# Patient Record
Sex: Female | Born: 1995 | Race: Black or African American | Hispanic: No | Marital: Single
Health system: Midwestern US, Community
[De-identification: ages and names within clinical notes are randomized; demographics above are authoritative.]

## PROBLEM LIST (undated history)

## (undated) DIAGNOSIS — F32A Depression, unspecified: Secondary | ICD-10-CM

## (undated) DIAGNOSIS — B999 Unspecified infectious disease: Secondary | ICD-10-CM

## (undated) DIAGNOSIS — O24419 Gestational diabetes mellitus in pregnancy, unspecified control: Secondary | ICD-10-CM

## (undated) DIAGNOSIS — N83209 Unspecified ovarian cyst, unspecified side: Secondary | ICD-10-CM

## (undated) DIAGNOSIS — F329 Major depressive disorder, single episode, unspecified: Secondary | ICD-10-CM

## (undated) DIAGNOSIS — Z9889 Other specified postprocedural states: Secondary | ICD-10-CM

## (undated) HISTORY — PX: TONSILLECTOMY: SUR1361

## (undated) HISTORY — PX: KNEE SURGERY: SHX244

---
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
## 2016-08-12 DIAGNOSIS — R519 Headache, unspecified: Secondary | ICD-10-CM

## 2016-08-12 NOTE — ED Notes (Signed)
Visual acuity:  Patient states, "i cannot see out of the right eye, I can see light but I cannot see those letters"  Left eye: 20, 80  Both eyes together: 20, [address]      Maayan Jenning, RN  08/12/16 2041

## 2016-08-12 NOTE — Other (Unsigned)
Patient Acct Nbr: [id number]   Primary AUTH/CERT:   Primary Insurance Company Name: [employer]  Primary Insurance Plan name: [employer]  Primary Insurance Group Number:   Primary Insurance Plan Type: Health  Primary Insurance Policy Number: [id number]

## 2016-08-12 NOTE — ED Provider Notes (Signed)
[id number] EMERGENCY DEPT  eMERGENCY dEPARTMENT eNCOUnter      Pt Name: Kimberly Mcknight  MRN: [id number]  Birthdate December 04, 1995  Date of evaluation: 08/12/2016  Provider: Lara MulchHARLES Kwabena Strutz, PA     CHIEF COMPLAINT       Chief Complaint   Patient presents with   ??? Eye Pain   ??? Headache         HISTORY OF PRESENT ILLNESS   (Location/Symptom, Timing/Onset, Context/Setting, Quality, Duration, Modifying Factors, Severity) Note limiting factors.   HPI    Kimberly Mcknight is a 20 y.o. female who presents to the emergency department With chief complaint of 48 hours of worsening headache on her RIGHT temple which is now spreading to her LEFT temple. Patient has diagnosed optic neuritis states she has been having a problem with it for one month. Patient states she sees a Geophysical data processoroptical neurologist in [place] in [place] but is here in [place] visiting friends. Patient notes that she has blurry vision in her RIGHT eyes since this episode started one month ago. Patient notes that she is beginning to develop blurry vision slowly in her left eye and is noting pain that tracks down the side of her temple toward her ear. Patient states this is 10 out of 10 and she notes that light aggravates this pain she notes no alleviating factors. Denies sudden loss of vision, [facility], optical flashing or pain on motion of her eyes. Eyes fever chills and sweats      Nursing Notes were reviewed.    REVIEW OF SYSTEMS    (2+ for level 4; 10+ for level 5)   Review of Systems   Constitutional: Negative for diaphoresis and fever.   HENT: Negative for trouble swallowing and voice change.    Eyes: Positive for photophobia, pain and visual disturbance. Negative for discharge.   Respiratory: Negative for cough, shortness of breath, wheezing and stridor.    Cardiovascular: Negative for chest pain, palpitations and leg swelling.   Gastrointestinal: Negative for abdominal pain, constipation, diarrhea, nausea and vomiting.   Genitourinary: Negative for  dysuria and hematuria.   Musculoskeletal: Negative for gait problem and neck pain.   Skin: Negative for color change.   Neurological: Positive for headaches. Negative for light-headedness.   Psychiatric/Behavioral: Negative for behavioral problems, confusion and sleep disturbance.       PAST MEDICAL HISTORY   No past medical history on file.    SURGICAL HISTORY     No past surgical history on file.    CURRENT MEDICATIONS       Previous Medications    No medications on file       ALLERGIES     Sulfa antibiotics    FAMILY HISTORY     No family history on file.     SOCIAL HISTORY       Social History     Social History   ??? Marital status: Single     Spouse name: N/A   ??? Number of children: N/A   ??? Years of education: N/A     Social History Main Topics   ??? Smoking status: Not on file   ??? Smokeless tobacco: Not on file   ??? Alcohol use Not on file   ??? Drug use: Unknown   ??? Sexual activity: Not on file     Other Topics Concern   ??? Not on file     Social History Narrative   ??? No narrative on file  SCREENINGS           PHYSICAL EXAM    (up to 7 for level 4, 8 or more for level 5)     ED Triage Vitals [08/12/16 1958]   BP Temp Temp Source Pulse Resp SpO2 Height Weight   126/83 97.8 ??F (36.6 ??C) Oral 93 16 100 % 5\' 2"  (1.575 m) 124 lb (56.2 kg)       Physical Exam   Constitutional: She is oriented to person, place, and time. She appears well-developed and well-nourished.   HENT:   Head: Normocephalic and atraumatic. Head is without raccoon's eyes, without Battle's sign, without abrasion and without contusion. Hair is normal.   Right Ear: Hearing, tympanic membrane, external ear and ear canal normal.   Left Ear: Hearing, tympanic membrane, external ear and ear canal normal.   Mouth/Throat: Oropharynx is clear and moist. Mucous membranes are not dry. No oropharyngeal exudate, posterior oropharyngeal edema, posterior oropharyngeal erythema or tonsillar abscesses.   No tenderness to palpation in the distribution of the  temporal artery.   Eyes: Conjunctivae and EOM are normal. Pupils are equal, round, and reactive to light. Left conjunctiva is not injected. No scleral icterus.   No visible papilledema     Neck: Normal range of motion. Neck supple. No spinous process tenderness and no muscular tenderness present. No neck rigidity. No edema, no erythema and normal range of motion present.   Cardiovascular: Normal rate, regular rhythm, S1 normal and S2 normal.  PMI is not displaced.  Exam reveals no gallop, no distant heart sounds and no friction rub.    No murmur heard.  Pulmonary/Chest: Effort normal and breath sounds normal. No accessory muscle usage. No respiratory distress. She has no decreased breath sounds. She has no wheezes. She has no rhonchi. She has no rales.   Abdominal: Soft. Bowel sounds are normal. She exhibits no shifting dullness, no distension, no pulsatile liver, no fluid wave, no abdominal bruit, no ascites and no pulsatile midline mass. There is no tenderness. There is no rigidity, no rebound and no guarding.   Musculoskeletal: Normal range of motion. She exhibits no deformity.   Neurological: She is alert and oriented to person, place, and time. She displays no tremor. No cranial nerve deficit or sensory deficit. She exhibits normal muscle tone. GCS eye subscore is 4. GCS verbal subscore is 5. GCS motor subscore is 6.   Reflex Scores:       Patellar reflexes are 2+ on the right side and 2+ on the left side.       Achilles reflexes are 2+ on the right side and 2+ on the left side.  Normal finger-nose. Vision normal to direct confrontation. Extraocular motion intact without nystagmus. Speech clear, no facial droop, no deviation on tongue extension, normal shoulder shrug, normal palate lift.  No aphasia.   Skin: Skin is warm and dry.   Psychiatric: Judgment and thought content normal.   Nursing note and vitals reviewed.      DIAGNOSTIC RESULTS     EKG (Per Emergency Physician):       RADIOLOGY (Per Emergency  Physician):       Interpretation per the Radiologist below, if available at the time of this note:  No results found.    ED BEDSIDE ULTRASOUND:   Performed by ED Physician - none    LABS:  Labs Reviewed   URINE CULTURE   URINALYSIS    Narrative:     Test Performed by [facility], [address] [place], [place]  [address]   PREGNANCY, URINE    Narrative:     Test Performed by [facility], [address] [place], [place]  [address]        All other labs were within normal range or not returned as of this dictation.    EMERGENCY DEPARTMENT COURSE and DIFFERENTIAL DIAGNOSIS/MDM:   Vitals:    Vitals:    08/12/16 1958   BP: 126/83   Pulse: 93   Resp: 16   Temp: 97.8 ??F (36.6 ??C)   TempSrc: Oral   SpO2: 100%   Weight: 56.2 kg (124 lb)   Height: 5\' 2"  (1.575 m)       Medications   0.9 % sodium chloride bolus (0 mLs Intravenous Stopped 08/12/16 2204)   metoclopramide (REGLAN) injection 10 mg (10 mg Intravenous Given 08/12/16 2049)   diphenhydrAMINE (BENADRYL) injection 50 mg (50 mg Intravenous Given 08/12/16 2048)   methylPREDNISolone sodium (SOLU-MEDROL) injection 125 mg (125 mg Intravenous Given 08/12/16 2049)   ondansetron (ZOFRAN) injection 4 mg (4 mg Intravenous Given 08/12/16 2140)       MDM.  Patient had relief of symptoms after Zofran, Reglan, Benadryl, Solu-Medrol and 1 L of IV normal saline. Patient states she felt much better and was okay to go home. Patient was discussed with ophthalmology on-call who asked that the patient follow-up either tomorrow in their clinic or tomorrow with her ophthalmologist at home. Patient was informed she should make an appointment either with the eye clinic tomorrow or with the ophthalmologist. Patient was impressed that it was important to be seen as her symptoms are worsening. Patient has no emergent signs at the moment per ophthalmology and patient had relief of headache symptoms. Patient will be discharged home in stable condition with instructions to follow up with primary  care and with ophthalmology as soon as possible. Patient was given instructions to return to clinic or to the nearest ER if she noted focal neural weakness worsening changes in vision or fever.  Patient questions were answered to their satisfaction and patient will be discharged home in stable condition. Patient understands and agrees with this plan    REVAL:         CRITICAL CARE TIME   Total Critical Care time was 0 minutes, excluding separately reportable procedures.  There was a high probability of clinically significant/life threatening deterioration in the patient's condition which required my urgent intervention.     CONSULTS:  None    PROCEDURES:  Unless otherwise noted below, none     Procedures    FINAL IMPRESSION      1. Nonintractable headache, unspecified chronicity pattern, unspecified headache type    2. Photophobia of both eyes          DISPOSITION/PLAN   DISPOSITION Decision To Discharge 08/12/2016 09:59:07 PM      PATIENT REFERRED TO:  Ophthalmology Clnic  [address]  [place] [address]  [phone]  On 08/13/2016  Follow up with your Optical Neurologist OR with the Opthalmology Clinic      DISCHARGE MEDICATIONS:  New Prescriptions    No medications on file          (Please note:  Portions of this note were completed with a voice recognition program.  Efforts were made to edit the dictations but occasionally words and phrases are mis-transcribed.)  Form v2016.J.[id number]    Lara Mulch, PA (electronically signed)  Emergency Medicine Provider  Lara Mulch, [place]  08/12/16 2229

## 2016-08-12 NOTE — ED Provider Notes (Signed)
Emer[facility] Department Encounter  [facility]    Patient: Kimberly Mouldingmaria Salata  MRN: [id number]  DOB: April 23, 1996  Date of Evaluation: 08/12/2016  ED Provider: Truman HaywardNicole A Navarro Nine, CNP    I saw the patient as the Clinician in Triage and performed a brief history and physical exam, established acuity, and ordered appropriate tests to develop basic plan of care. Patient will be seen by APP and/or my physician partner who will evaluate the patient.     Brief HPI: In brief, Kimberly Mouldingmaria Mcgirr is a 20 y.o. female that presents for bilateral eye pain with photophobia for 2 days. Patient also reports clear eye drainage. 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 Patient wears prescriptive glasses. No contacts. The patient has no other medical complaints. Review of systems otherwise negative.  Focused Physical exam: General: NAD  Eyes: +injected bilaterally with white crusts noted to the upper lids  Lungs: CTAB No use of accessory muscles.  Heart: RRR No obvious murmur     Plan: [facility] ordered. Pt to be transferred to an ER bed for further medical evaluation and care.    Please see subsequent provider note for further details and disposition   Comment: Please note this report has been produced using speech recognition software and may contain errors related to that system including errors in grammar, punctuation, and spelling as well as words and phrases that may be inappropriate. If there are any questions or concerns please feel free to contact the dictating provider for clarification    Truman HaywardNicole A Jacalynn Buzzell, CNP  [employer]       Truman Haywardicole A Sammye Staff, CNP  08/12/16 2009

## 2016-08-12 NOTE — Progress Notes (Signed)
Culture reviewed. Awaiting sensitivity results

## 2016-08-12 NOTE — ED Provider Notes (Signed)
[facility]  eMERGENCY dEPARTMENT eNCOUnter - Supervisory Note       Pt Name: Kimberly Mcknight  MRN: [id number]  Birthdate 04/03/1996  Date of evaluation: 08/12/2016  Provider: Noralee StainScott E Adalay Azucena, MD  PCP: No primary care provider on file.  ED Attending: Noralee StainScott E Ailany Koren, MD    CHIEF COMPLAINT       Chief Complaint   Patient presents with   ??? Eye Pain   ??? Headache       I have seen and evaluated this patient with the resident/APP.  I have participated in the care and disposition of this patient.  I have interviewed and examined this patient.  20 year old female the history of optic neuritis she has a follow-up appointment scheduled. She is having a typical exacerbation the migraine related to this associated photophobia. Physical examination pupils are equal and reactive she is photophobic lungs are clear heart is regular rate and rhythm. She has no focal neurologic deficits at this time. Patient will be discharged home after medication for the headache she can continue her outpatient follow-up consultation was placed to ophthalmology.    REVIEW OF SYSTEMS       10 systems were reviewed and found to be negative other than what is listed in the HPI.       PAST MEDICAL HISTORY   No past medical history on file.      SURGICAL HISTORY     No past surgical history on file.      CURRENT MEDICATIONS       Previous Medications    No medications on file         ALLERGIES     Sulfa antibiotics    FAMILY HISTORY     No family history on file.       SOCIAL HISTORY       Social History     Social History   ??? Marital status: Single     Spouse name: N/A   ??? Number of children: N/A   ??? Years of education: N/A     Social History Main Topics   ??? Smoking status: Not on file   ??? Smokeless tobacco: Not on file   ??? Alcohol use Not on file   ??? Drug use: Unknown   ??? Sexual activity: Not on file     Other Topics Concern   ??? Not on file     Social History Narrative   ??? No narrative on file       SCREENINGS               DIAGNOSTIC RESULTS    LABS:    Labs Reviewed   URINE CULTURE   URINALYSIS    Narrative:     Test Performed by [facility], [address] [place], [place]  [phone]   PREGNANCY, URINE    Narrative:     Test Performed by [facility], [address] [place], [place]  [phone]       All other labs were within normal range or not returned as of this dictation.    EKG: All EKG's are interpreted by the Emergency Department Physician who either signs or Co-signs this chart in the absence of a cardiologist.  Please see their note for interpretation of EKG.      RADIOLOGY:   Non-plain film images such as CT, Ultrasound and MRI are read by the radiologist. Plain radiographic images are visualized and preliminarily interpreted by  the  ED Provider with the below findings:      Interpretation per the Radiologist below, if available at the time of this note:    No orders to display     No results found.    PROCEDURES   Unless otherwise noted below, none     Procedures    CRITICAL CARE TIME   N/A    CONSULTS:  None      EMERGENCY DEPARTMENT COURSE and DIFFERENTIAL DIAGNOSIS/MDM:   Vitals:    Vitals:    08/12/16 1958   BP: 126/83   Pulse: 93   Resp: 16   Temp: 97.8 ??F (36.6 ??C)   TempSrc: Oral   SpO2: 100%   Weight: 56.2 kg (124 lb)   Height: 5\' 2"  (1.575 m)       Patient was given the following medications:  Medications   0.9 % sodium chloride bolus (1,000 mLs Intravenous New Bag 08/12/16 2051)   metoclopramide (REGLAN) injection 10 mg (10 mg Intravenous Given 08/12/16 2049)   diphenhydrAMINE (BENADRYL) injection 50 mg (50 mg Intravenous Given 08/12/16 2048)   methylPREDNISolone sodium (SOLU-MEDROL) injection 125 mg (125 mg Intravenous Given 08/12/16 2049)   ondansetron (ZOFRAN) injection 4 mg (4 mg Intravenous Given 08/12/16 2140)           FINAL IMPRESSION      1. Intractable episodic headache, unspecified headache type          DISPOSITION/PLAN   DISPOSITION        PATIENT REFERRED TO:  No follow-up provider specified.    DISCHARGE  MEDICATIONS:  New Prescriptions    No medications on file       DISCONTINUED MEDICATIONS:  Discontinued Medications    No medications on file              (Please note that portions of this note were completed with a voice recognition program.  Efforts were made to edit the dictations but occasionally words are mis-transcribed.)    Noralee StainScott E Kyrianna Barletta, MD (electronically signed)           Noralee StainScott E Aireona Torelli, MD  08/12/16 2142

## 2016-08-12 NOTE — Telephone Encounter (Signed)
ER physician calls today: [age] year old female from [place] with a history of optic neuritis right eye: treated and [place]. Patient is visiting here in [place] for the holidays. States that right eye has had an extensive work up and become gradually foggy within the last 1 month. Left eye: within the last week has started to become foggy. Patient states right sided headache and sensitivity has extended to the left side. Given solumedrol in ER. Per ER physician exam: denies CVF loss and no signs of trauma. PERRL. Vision is currently being obtained however with the following information and chronicity of the decreasing vision: patient can follow-up tomorrow in the clinic at 12 PM.     If any flashing lights, field cut, or sudden changes upon re-examination ER to notify ophtho otherwise will see patient in outpt clinic [address]  At 12:00 noon. Emphasized patient's need to follow-up with primary ophthalmologist as soon as possible: if unable to be seen tomorrow then [facility].     FULL EXAM with MRx.

## 2016-08-12 NOTE — ED Triage Notes (Signed)
Patient coming in with complaints of opticneuritis. Was diagnosed with this in march. She is having clear drainage and eye redness. Also complaining of headache, pain 10/10.

## 2016-08-12 NOTE — Progress Notes (Signed)
Culture reviewed, please contact patient and inform them of the results and that patient needs treated for urinary tract infection.  Recommend prescription: Keflex 500 mg one p.o. t.i.d. #21.  No refill

## 2016-08-12 NOTE — ED Notes (Signed)
Patient given discharge instructions. Patient has no further questions at this time . Patient's iv removed. Patient is alert and oriented and vital signs are stable at this time. Patient does have a ride home. Patient will follow up with her eye doctor tomorrow      Pearlie Oysterlizabeth Haizlee Henton, RN  08/12/16 2249

## 2016-08-12 NOTE — ED Notes (Signed)
Bed: 10  Expected date:   Expected time:   Means of arrival:   Comments:  Triage       Alverda SkeansKatrina Paridon, RN  08/12/16 2014

## 2016-08-13 ENCOUNTER — Inpatient Hospital Stay
Admit: 2016-08-13 | Discharge: 2016-08-13 | Disposition: A | Discharge status: Home / Self Care | Attending: Emergency Medicine

## 2016-08-13 ENCOUNTER — Encounter

## 2016-08-13 LAB — URINALYSIS
Bilirubin, Urine: NEGATIVE NA
Ketones, Urine: NEGATIVE mg/dL
LEUKOCYTES, UA: NEGATIVE NA
Nitrite, Urine: NEGATIVE NA
Occult Blood,Urine: NEGATIVE {RBC}/uL
Specific Gravity, Urine: 1.02 NA (ref 1.005–1.030)
Total Protein, Urine: NEGATIVE mg/dL
pH, Urine: 5 NA (ref 5.0–8.0)

## 2016-08-13 LAB — PREGNANCY, URINE: HCG Urine: NEGATIVE NA

## 2016-08-13 MED ORDER — ONDANSETRON HCL 4 MG/2ML IJ SOLN
4 MG/2ML | Freq: Once | INTRAMUSCULAR | Status: AC
Start: 2016-08-13 — End: 2016-08-12
  Administered 2016-08-13: 03:00:00 4 mg via INTRAVENOUS

## 2016-08-13 MED ORDER — METHYLPREDNISOLONE SODIUM SUCC 125 MG IJ SOLR
125 MG | Freq: Once | INTRAMUSCULAR | Status: AC
Start: 2016-08-13 — End: 2016-08-12
  Administered 2016-08-13: 02:00:00 125 mg via INTRAVENOUS

## 2016-08-13 MED ORDER — SODIUM CHLORIDE 0.9 % IV BOLUS
0.9 % | Freq: Once | INTRAVENOUS | Status: AC
Start: 2016-08-13 — End: 2016-08-12
  Administered 2016-08-13: 02:00:00 1000 mL via INTRAVENOUS

## 2016-08-13 MED ORDER — METOCLOPRAMIDE HCL 5 MG/ML IJ SOLN
5 MG/ML | Freq: Once | INTRAMUSCULAR | Status: AC
Start: 2016-08-13 — End: 2016-08-12
  Administered 2016-08-13: 02:00:00 10 mg via INTRAVENOUS

## 2016-08-13 MED ORDER — DIPHENHYDRAMINE HCL 50 MG/ML IJ SOLN
50 MG/ML | Freq: Once | INTRAMUSCULAR | Status: AC
Start: 2016-08-13 — End: 2016-08-12
  Administered 2016-08-13: 02:00:00 50 mg via INTRAVENOUS

## 2016-08-13 MED FILL — ONDANSETRON HCL 4 MG/2ML IJ SOLN: 4 MG/2ML | INTRAMUSCULAR | Qty: 2

## 2016-08-13 MED FILL — METHYLPREDNISOLONE SODIUM SUCC 125 MG IJ SOLR: 125 MG | INTRAMUSCULAR | Qty: 125

## 2016-08-13 MED FILL — DIPHENHYDRAMINE HCL 50 MG/ML IJ SOLN: 50 MG/ML | INTRAMUSCULAR | Qty: 1

## 2016-08-13 MED FILL — METOCLOPRAMIDE HCL 5 MG/ML IJ SOLN: 5 MG/ML | INTRAMUSCULAR | Qty: 2

## 2016-08-13 NOTE — Telephone Encounter (Signed)
Added on as directed for 08/13/16 at 12 PM.

## 2016-08-13 NOTE — Telephone Encounter (Signed)
Please call patient and notify that she should seek ophthalmic care within the next 1-2 days.  Thanks. KP

## 2016-08-13 NOTE — Telephone Encounter (Signed)
Acknowledged.

## 2016-08-13 NOTE — Telephone Encounter (Signed)
Pt no-showed for 08/13/16 appointment. Please advise front desk pool.

## 2016-08-13 NOTE — Telephone Encounter (Signed)
Called patient and she stated that she went to go see her ophthomologist that's why she didn't come here.

## 2016-08-14 LAB — CULTURE, URINE: Urine Culture, Routine: >100000

## 2019-06-19 LAB — OB RESULTS CONSOLE RUBELLA ANTIBODY, IGM: Rubella: IMMUNE

## 2019-06-19 LAB — OB RESULTS CONSOLE ABO/RH: RH Type: POSITIVE

## 2019-06-19 LAB — OB RESULTS CONSOLE ANTIBODY SCREEN: Antibody Screen: NEGATIVE

## 2019-06-19 LAB — OB RESULTS CONSOLE RPR: RPR: NONREACTIVE

## 2019-06-19 LAB — OB RESULTS CONSOLE HIV ANTIBODY (ROUTINE TESTING): HIV: NONREACTIVE

## 2019-06-19 LAB — OB RESULTS CONSOLE GC/CHLAMYDIA
Chlamydia: NEGATIVE
Gonorrhea: NEGATIVE

## 2019-06-19 LAB — OB RESULTS CONSOLE HEPATITIS B SURFACE ANTIGEN: Hepatitis B Surface Ag: NEGATIVE

## 2019-07-03 ENCOUNTER — Other Ambulatory Visit (HOSPITAL_COMMUNITY): Payer: Self-pay | Admitting: Obstetrics and Gynecology

## 2019-07-09 ENCOUNTER — Other Ambulatory Visit: Payer: Self-pay

## 2019-07-09 ENCOUNTER — Inpatient Hospital Stay (HOSPITAL_COMMUNITY)
Admission: AD | Admit: 2019-07-09 | Discharge: 2019-07-09 | Disposition: A | Discharge status: Home / Self Care | Payer: Medicaid Other | Attending: Obstetrics & Gynecology | Admitting: Obstetrics & Gynecology

## 2019-07-09 ENCOUNTER — Encounter (HOSPITAL_COMMUNITY): Payer: Self-pay | Admitting: *Deleted

## 2019-07-09 DIAGNOSIS — O210 Mild hyperemesis gravidarum: Secondary | ICD-10-CM | POA: Insufficient documentation

## 2019-07-09 DIAGNOSIS — O99612 Diseases of the digestive system complicating pregnancy, second trimester: Secondary | ICD-10-CM

## 2019-07-09 DIAGNOSIS — K5900 Constipation, unspecified: Secondary | ICD-10-CM

## 2019-07-09 DIAGNOSIS — Z3A17 17 weeks gestation of pregnancy: Secondary | ICD-10-CM | POA: Insufficient documentation

## 2019-07-09 DIAGNOSIS — O26892 Other specified pregnancy related conditions, second trimester: Secondary | ICD-10-CM | POA: Diagnosis not present

## 2019-07-09 DIAGNOSIS — D649 Anemia, unspecified: Secondary | ICD-10-CM | POA: Insufficient documentation

## 2019-07-09 DIAGNOSIS — R109 Unspecified abdominal pain: Secondary | ICD-10-CM | POA: Diagnosis not present

## 2019-07-09 DIAGNOSIS — O219 Vomiting of pregnancy, unspecified: Secondary | ICD-10-CM

## 2019-07-09 DIAGNOSIS — O99012 Anemia complicating pregnancy, second trimester: Secondary | ICD-10-CM | POA: Diagnosis not present

## 2019-07-09 HISTORY — DX: Unspecified infectious disease: B99.9

## 2019-07-09 HISTORY — DX: Unspecified ovarian cyst, unspecified side: N83.209

## 2019-07-09 HISTORY — DX: Depression, unspecified: F32.A

## 2019-07-09 LAB — URINALYSIS, ROUTINE W REFLEX MICROSCOPIC
Bilirubin Urine: NEGATIVE
Glucose, UA: NEGATIVE mg/dL
Hgb urine dipstick: NEGATIVE
Ketones, ur: 5 mg/dL — AB
Leukocytes,Ua: NEGATIVE
Nitrite: NEGATIVE
Protein, ur: NEGATIVE mg/dL
Specific Gravity, Urine: 1.015 (ref 1.005–1.030)
pH: 6 (ref 5.0–8.0)

## 2019-07-09 MED ORDER — METOCLOPRAMIDE HCL 10 MG PO TABS: 10.0000 mg | ORAL_TABLET | Freq: Three times a day (TID) | ORAL | 0 refills | Status: DC | PRN

## 2019-07-09 NOTE — MAU Note (Signed)
Hasn't had a BM in almost 2 wks.   Has tried a stool softener, miralax, apple juice and prune juice.  Feeling super weak. Feeling crampy.

## 2019-07-09 NOTE — MAU Provider Note (Signed)
Chief Complaint: Abdominal Pain and Constipation   First Provider Initiated Contact with Patient 07/09/19 1705     SUBJECTIVE HPI: Janet Mckee is a 23 y.o. G3P2002 at [redacted]w[redacted]d who presents to [facility] Admissions reporting constipation. Reports last BM was over 2 weeks ago. Has tried treating with miralax, colace, apple juice, & prune juice without improvement. Has been taking zofran regularly for nausea as well as iron supplements for anemia. Has some abdominal cramping & feels bloated. Denies fever/chills, dysuria, vaginal bleeding, or LOF. Goes to [facility] for prenatal care.   Location: abdomen Quality: cramping Severity: currently none/10 on pain scale Duration: 1 week Timing: intermittent Modifying factors: none Associated signs and symptoms: constipation  Past Medical History:  Diagnosis Date  . Depression    PP with 2nd child, ok child  . Infection    UTI  . Ovarian cyst    OB History  Gravida Para Term Preterm AB Living  3 2 2  0 0 2  SAB TAB Ectopic Multiple Live Births          2    # Outcome Date GA Lbr Len/2nd Weight Sex Delivery Anes PTL Lv  3 Current           2 Term     M Vag-Spont  N LIV  1 Term     M Vag-Spont  N LIV   Past Surgical History:  Procedure Laterality Date  . KNEE SURGERY Bilateral    patellar realignment   Social History   Socioeconomic History  . Marital status: Single    Spouse name: Not on file  . Number of children: Not on file  . Years of education: Not on file  . Highest education level: Not on file  Occupational History  . Not on file  Social Needs  . Financial resource strain: Not on file  . Food insecurity    Worry: Not on file    Inability: Not on file  . Transportation needs    Medical: Not on file    Non-medical: Not on file  Tobacco Use  . Smoking status: Never Smoker  . Smokeless tobacco: Never Used  Substance and Sexual Activity  . Alcohol use: Never    Frequency: Never  . Drug use: Never  . Sexual activity: Yes   Lifestyle  . Physical activity    Days per week: Not on file    Minutes per session: Not on file  . Stress: Not on file  Relationships  . Social Herbalist on phone: Not on file    Gets together: Not on file    Attends religious service: Not on file    Active member of club or organization: Not on file    Attends meetings of clubs or organizations: Not on file    Relationship status: Not on file  . Intimate partner violence    Fear of current or ex partner: Not on file    Emotionally abused: Not on file    Physically abused: Not on file    Forced sexual activity: Not on file  Other Topics Concern  . Not on file  Social History Narrative  . Not on file   Family History  Problem Relation Age of Onset  . Anemia Mother    No current facility-administered medications on file prior to encounter.    Current Outpatient Medications on File Prior to Encounter  Medication Sig Dispense Refill  . docusate sodium (COLACE) 100 MG capsule Take  100 mg by mouth daily.    . polyethylene glycol (MIRALAX / GLYCOLAX) 17 g packet Take 17 g by mouth daily.    . Prenatal Vit-Fe Fumarate-FA (MULTIVITAMIN-PRENATAL) 27-0.8 MG TABS tablet Take 1 tablet by mouth daily at 12 noon.     Allergies  Allergen Reactions  . Sulfa Antibiotics Hives    I have reviewed patient's Past Medical Hx, Surgical Hx, Family Hx, Social Hx, medications and allergies.   Review of Systems  Constitutional: Negative.   Gastrointestinal: Positive for abdominal distention, abdominal pain (none currently), constipation and nausea. Negative for vomiting.  Genitourinary: Negative.     OBJECTIVE Patient Vitals for the past 24 hrs:  BP Temp Temp src Pulse Resp SpO2 Height Weight  07/09/19 1830 (!) 108/51 - - 84 - - - -  07/09/19 1636 126/81 - - (!) 105 - - - -  07/09/19 1624 118/74 98.1 F (36.7 C) Oral 97 18 100 % 5\' 2"  (1.575 m) 71.7 kg   Constitutional: Well-developed, well-nourished female in no acute  distress.  Cardiovascular: normal rate & rhythm, no murmur Respiratory: normal rate and effort. Lung sounds clear throughout ZO:[id number]:Distended. Abd soft, non-tender, Pos BS x 4. No guarding or rebound tenderness MS: Extremities nontender, no edema, normal ROM Neurologic: Alert and oriented x 4.      LAB RESULTS Results for orders placed or performed during the hospital encounter of 07/09/19 (from the past 24 hour(s))  Urinalysis, Routine w reflex microscopic     Status: Abnormal   Collection Time: 07/09/19  4:45 PM  Result Value Ref Range   Color, Urine YELLOW YELLOW   APPearance CLEAR CLEAR   Specific Gravity, Urine 1.015 1.005 - 1.030   pH 6.0 5.0 - 8.0   Glucose, UA NEGATIVE NEGATIVE mg/dL   Hgb urine dipstick NEGATIVE NEGATIVE   Bilirubin Urine NEGATIVE NEGATIVE   Ketones, ur 5 (A) NEGATIVE mg/dL   Protein, ur NEGATIVE NEGATIVE mg/dL   Nitrite NEGATIVE NEGATIVE   Leukocytes,Ua NEGATIVE NEGATIVE    IMAGING No results found.  MAU COURSE Orders Placed This Encounter  Procedures  . Urinalysis, Routine w reflex microscopic  . Discharge patient   Meds ordered this encounter  Medications  . metoCLOPramide (REGLAN) 10 MG tablet    Sig: Take 1 tablet (10 mg total) by mouth every 8 (eight) hours as needed for nausea.    Dispense:  30 tablet    Refill:  0    Order Specific Question:   Supervising Provider    Answer:   Jaynie CollinsANYANWU, UGONNA A [[id number]]    MDM FHT present via doppler Pt afebrile.  Abdomen soft with bowel sounds throughout but appears to be distended.   Soap suds enema given with moderate results. Patient reports feeling much better and less bloated.  Constipation likely made worse by zofran & iron usage. Will switch out antiemetic. Discussed treatment of constipation at home.   ASSESSMENT 1. Constipation during pregnancy in second trimester   2. [redacted] weeks gestation of pregnancy   3. Nausea and vomiting during pregnancy prior to [redacted] weeks gestation      PLAN Discharge home in stable condition. Discussed reasons to return to MAU D/c zofran Rx reglan  Allergies as of 07/09/2019      Reactions   Sulfa Antibiotics Hives      Medication List    STOP taking these medications   ondansetron 4 MG tablet Commonly known as: ZOFRAN     TAKE these medications   docusate  sodium 100 MG capsule Commonly known as: COLACE Take 100 mg by mouth daily.   metoCLOPramide 10 MG tablet Commonly known as: REGLAN Take 1 tablet (10 mg total) by mouth every 8 (eight) hours as needed for nausea.   multivitamin-prenatal 27-0.8 MG Tabs tablet Take 1 tablet by mouth daily at 12 noon.   polyethylene glycol 17 g packet Commonly known as: MIRALAX / GLYCOLAX Take 17 g by mouth daily.        Judeth Horn, NP 07/10/2019  8:17 AM

## 2019-07-09 NOTE — Discharge Instructions (Signed)
You have constipation which is hard stools that are difficult to pass. It is important to have regular bowel movements every 1-3 days that are soft and easy to pass. Hard stools increase your risk of hemorrhoids and are very uncomfortable.   To prevent constipation you can increase the amount of fiber in your diet. Examples of foods with fiber are leafy greens, whole grain breads, oatmeal and other grains.  It is also important to drink at least eight 8oz glass of water everyday.   If you have not has a bowel movement in 4-5 days you made need to clean out your bowel.  This will have establish normal movement through your bowel.    Miralax Clean out  Take 8 capfuls of miralax in 64 oz of gatorade. You can use any fluid that appeals to you (gatorade, water, juice)  Continue to drink at least eight 8 oz glasses of water throughout the day  You can repeat with another 8 capfuls of miralax in 64 oz of gatorade if you are not having a large amount of stools  You will need to be at home and close to a bathroom for about 8 hours when you do the above as you may need to go to the bathroom frequently.   After you are cleaned out: - Start Colace100mg  twice daily - Start Miralax once daily - Start a daily fiber supplement like metamucil or citrucel - You can safely use enemas in pregnancy  - if you are having diarrhea you can reduce to Colace once a day or miralax every other day or a 1/2 capful daily.      High-Fiber Diet Fiber, also called dietary fiber, is a type of carbohydrate that is found in fruits, vegetables, whole grains, and beans. A high-fiber diet can have many health benefits. Your health care provider may recommend a high-fiber diet to help:  Prevent constipation. Fiber can make your bowel movements more regular.  Lower your cholesterol.  Relieve the following conditions: ? Swelling of veins in the anus (hemorrhoids). ? Swelling and irritation (inflammation) of specific areas  of the digestive tract (uncomplicated diverticulosis). ? A problem of the large intestine (colon) that sometimes causes pain and diarrhea (irritable bowel syndrome, IBS).  Prevent overeating as part of a weight-loss plan.  Prevent heart disease, type 2 diabetes, and certain cancers. What is my plan? The recommended daily fiber intake in grams (g) includes:  38 g for men age 50 or younger.  30 g for men over age 34.  24 g for women age 75 or younger.  21 g for women over age 18. You can get the recommended daily intake of dietary fiber by:  Eating a variety of fruits, vegetables, grains, and beans.  Taking a fiber supplement, if it is not possible to get enough fiber through your diet. What do I need to know about a high-fiber diet?  It is better to get fiber through food sources rather than from fiber supplements. There is not a lot of research about how effective supplements are.  Always check the fiber content on the nutrition facts label of any prepackaged food. Look for foods that contain 5 g of fiber or more per serving.  Talk with a diet and nutrition specialist (dietitian) if you have questions about specific foods that are recommended or not recommended for your medical condition, especially if those foods are not listed below.  Gradually increase how much fiber you consume. If you increase your  intake of dietary fiber too quickly, you may have bloating, cramping, or gas.  Drink plenty of water. Water helps you to digest fiber. What are tips for following this plan?  Eat a wide variety of high-fiber foods.  Make sure that half of the grains that you eat each day are whole grains.  Eat breads and cereals that are made with whole-grain flour instead of refined flour or white flour.  Eat brown rice, bulgur wheat, or millet instead of white rice.  Start the day with a breakfast that is high in fiber, such as a cereal that contains 5 g of fiber or more per serving.  Use  beans in place of meat in soups, salads, and pasta dishes.  Eat high-fiber snacks, such as berries, raw vegetables, nuts, and popcorn.  Choose whole fruits and vegetables instead of processed forms like juice or sauce. What foods can I eat?  Fruits Berries. Pears. Apples. Oranges. Avocado. Prunes and raisins. Dried figs. Vegetables Sweet potatoes. Spinach. Kale. Artichokes. Cabbage. Broccoli. Cauliflower. Green peas. Carrots. Squash. Grains Whole-grain breads. Multigrain cereal. Oats and oatmeal. Brown rice. Barley. Bulgur wheat. Millet. Quinoa. Bran muffins. Popcorn. Rye wafer crackers. Meats and other proteins Navy, kidney, and pinto beans. Soybeans. Split peas. Lentils. Nuts and seeds. Dairy Fiber-fortified yogurt. Beverages Fiber-fortified soy milk. Fiber-fortified orange juice. Other foods Fiber bars. The items listed above may not be a complete list of recommended foods and beverages. Contact a dietitian for more options. What foods are not recommended? Fruits Fruit juice. Cooked, strained fruit. Vegetables Fried potatoes. Canned vegetables. Well-cooked vegetables. Grains White bread. Pasta made with refined flour. White rice. Meats and other proteins Fatty cuts of meat. Fried chicken or fried fish. Dairy Milk. Yogurt. Cream cheese. Sour cream. Fats and oils Butters. Beverages Soft drinks. Other foods Cakes and pastries. The items listed above may not be a complete list of foods and beverages to avoid. Contact a dietitian for more information. Summary  Fiber is a type of carbohydrate. It is found in fruits, vegetables, whole grains, and beans.  There are many health benefits of eating a high-fiber diet, such as preventing constipation, lowering blood cholesterol, helping with weight loss, and reducing your risk of heart disease, diabetes, and certain cancers.  Gradually increase your intake of fiber. Increasing too fast can result in cramping, bloating, and gas.  Drink plenty of water while you increase your fiber.  The best sources of fiber include whole fruits and vegetables, whole grains, nuts, seeds, and beans. This information is not intended to replace advice given to you by your health care provider. Make sure you discuss any questions you have with your health care provider. Document Released: 08/09/2005 Document Revised: 06/13/2017 Document Reviewed: 06/13/2017 Elsevier Patient Education  2020 [employer].

## 2019-07-10 ENCOUNTER — Ambulatory Visit (HOSPITAL_COMMUNITY): Payer: Self-pay | Admitting: Genetic Counselor

## 2019-07-10 ENCOUNTER — Ambulatory Visit (HOSPITAL_COMMUNITY): Payer: Medicaid Other | Attending: Obstetrics and Gynecology | Admitting: Genetic Counselor

## 2019-07-10 DIAGNOSIS — Z315 Encounter for genetic counseling: Secondary | ICD-10-CM

## 2019-07-10 DIAGNOSIS — Z3A17 17 weeks gestation of pregnancy: Secondary | ICD-10-CM | POA: Diagnosis not present

## 2019-07-10 DIAGNOSIS — Z148 Genetic carrier of other disease: Secondary | ICD-10-CM

## 2019-07-10 NOTE — Progress Notes (Signed)
07/10/2019  Windell Mouldingmaria Fennema Jul 10, 1996 MRN: [id number] DOV: 07/10/2019  Ms. 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 Ms. Corine ShelterWatkins came to her appointment alone due to COVID-19 visitor restrictions.   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 Corine ShelterWatkins is a [id number], 23 y.o. female. Her current pregnancy has completed 6427w4d (Estimated Date of Delivery: 12/14/19).  Ms. Corine ShelterWatkins denied exposure to environmental toxins or chemical agents. She denied the use of alcohol, tobacco or street drugs. She reported taking prenatal vitamins. She denied significant viral illnesses, fevers, and bleeding during the course of her pregnancy. Her medical and surgical histories were noncontributory.  Family History  A three generation pedigree was drafted and reviewed. The family history is remarkable for the following:  - Ms. Trias's partner, Alphonsa GinRyan Lathon, has a maternal half sister who had a stillbirth. Ms. Corine ShelterWatkins was not sure of the cause of this stillbirth. Without more information about the potential etiology of this stillbirth, precise risk assessment is limited.  The remaining family histories were reviewed and found to be noncontributory for birth defects, intellectual disability, recurrent pregnancy loss, and known genetic conditions. Ms. Corine ShelterWatkins had limited information about her partner's family history; thus, risk assessment was limited.  The patient's ethnicity is [place] and Caucasian. The father of the pregnancy's ethnicity is African American. Ashkenazi Jewish ancestry and consanguinity were denied. Pedigree will be scanned under Media.  Discussion  Ms. Watkinshad Horizon-4carrier screeningperformedthrough [facility]. 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 The presence of this polymorphism puts her at increased risk (1 in 34, ~3%) to be a silent 2+0 carrier for spinal muscular atrophy (SMA). SMA is a condition caused by mutations in the SMN1 gene. Typically, individuals have two copies of the SMN1 gene, with one copy present on each chromosome. In SMA silent carriers, both copies of the SMN1 gene are present on one chromosome, with no copies of SMN1 present on the other chromosome.  SMA is characterized by progressive muscle weakness and atrophy due to degeneration and loss of anterior horn cells (lower motor neurons) in the spinal cord and brain stem. We discussed the different types of SMA (0, I, II, and III), including differences in severity and age of onset. We also reviewed the autosomal recessive inheritance pattern of SMA. Based on his ethnicity alone, Ms. Covington's partner has a 1 in 1566 chance of being a carrier of SMA. If he is found to have 2 copies of SMN1, his risk of being a carrier is reduced but not eliminated. If both parents are carriers of SMA, there is a 1 in 4 (25%) chance of having an affected fetus.   Ms. Laabs'scarrier screening was negative for the other 3conditions that she was screened for. She also had a normal hemoglobin electrophoresis, ruling out carrier status for clinically significant hemoglobinopathies such as sickle cell disease. Thus, her risk to be a carrier for these additional conditions (listed separately in the laboratory report) and thus to have an affected child has been reduced but not eliminated. We discussed that carrier testingfor SMA is recommended forMs. Leather's partner. Ms. Corine ShelterWatkins was interested in pursuing partner carrier screening. Additionally, we discussed theEarly Checkresearch studyto add SMA to her baby's  newborn screening panel free of charge. The Early Check study is able to identify infants  affected with SMA much earlier than clinical diagnoses of SMA are often made. Ms. Blew indicated that she was interested in pursuing this, so she was given written information on how to enroll in the Early Check study.  We also reviewed that Ms. Schraeder had Pan[facility]ama noninvasive prenatal screening (NIPS) through the laboratory [facility] that was low-risk for fetal aneuploidies. We reviewed that these results showed a less than 1 in 10,000 risk for trisomies 21, 18 and 13, and monosomy X (Turner syndrome).  In addition, the risk for triploidy and sex chromosome trisomies (47,XXX and 47,XXY) was also low. Ms. Catlin elected to have cfDNA analysis for 22q11.2 deletion syndrome, which was also low risk (1 in 9000). We reviewed that while this testing identifies 94-99% of pregnancies with trisomy 51, trisomy 80, sex chromosome trisomies (47,XXX and 47,XXY), and triploidy, it is NOT diagnostic. A positive test result requires confirmation by [facility] or amniocentesis, and a negative test result does not rule out a fetal chromosome abnormality. She also understands that this testing does not identify all genetic conditions.  Ms. Schryver was also counseled regarding diagnostic testing via amniocentesis. We discussed the technical aspects of the procedure and quoted up to a 1 in 500 (0.2%) risk for spontaneous pregnancy loss or other adverse pregnancy outcomes as a result of amniocentesis. Cultured cells from an amniocentesis sample allow for the visualization of a fetal karyotype, which can detect >99% of chromosomal aberrations. Chromosomal microarray can also be performed to identify smaller deletions or duplications of fetal chromosomal material. Amniocentesis could also be performed to assess whether the baby is affected by SMA. After careful consideration, Ms. Constantin declined amniocentesis at this time. She understands that amniocentesis is available at any point after 16 weeks of pregnancy and that she may opt to  undergo the procedure at a later date should she change her mind. She indicated that she may consider undergoing amniocentesis if her partner were identified to be a carrier for SMA.  Lastly, screening for open neural tube defects (ONTDs) via MS-AFP in the second trimester in addition to level II ultrasound examination is recommended. Level II ultrasound and MS-AFP are able to detect ONTDs with 90-95% sensitivity. However, normal results from any of the above options do not guarantee a normal baby, as 3-5% of newborns have some type of birth defect, many of which are not prenatally diagnosable.  Ms. Reddix desires carrier screening for her partner, Alphonsa Gin. However, she is not sure if her partner has health insurance. We discussed that if Mr. Sarajane Jews does not have insurance, I could potentially facilitate free testing through the laboratory [facility]'s Patient Assistance Program. I instructed Ms. Mihalko to call or email me once she has had an opportunity to discuss carrier screening with her partner. I will facilitate sample collection and the testing process from there. Results will take 2-3 weeks to be delivered from the time the laboratory receives the sample. I will call Ms. Corine Shelter when results become available.  I counseled Ms. Beringer regarding the above risks and available options. The approximate face-to-face time with the genetic counselor was 30 minutes.  In summary:  Discussed carrier screening results and options for follow-up testing  Increased risk (1 in 72) to be silent carrier for spinal muscular atrophy (SMA)  Desires partner carrier screening. Ms. Knieriem will contact me re: her partner's insurance status  Reviewed low-risk NIPS results  Reduction  in risk for Down syndrome, trisomy 79, trisomy 61, triploidy, sex chromosome aneuploidies, and 22q11.2 deletion syndrome  Offered additional testing and screening  Declined amniocentesis  Recommend MS-AFP screening in second  trimester  Reviewed family history concerns   Buelah Manis, MS Genetic Counselor

## 2019-07-18 ENCOUNTER — Encounter (HOSPITAL_COMMUNITY): Payer: Self-pay | Admitting: Obstetrics and Gynecology

## 2019-07-18 ENCOUNTER — Other Ambulatory Visit (HOSPITAL_COMMUNITY): Payer: Self-pay | Admitting: Obstetrics and Gynecology

## 2019-10-10 ENCOUNTER — Other Ambulatory Visit: Payer: Self-pay | Admitting: Obstetrics & Gynecology

## 2019-10-24 ENCOUNTER — Other Ambulatory Visit: Payer: Self-pay

## 2019-10-24 ENCOUNTER — Encounter: Payer: Self-pay | Admitting: Registered"

## 2019-10-24 ENCOUNTER — Encounter: Payer: Medicaid Other | Attending: Obstetrics and Gynecology | Admitting: Registered"

## 2019-10-24 DIAGNOSIS — O99810 Abnormal glucose complicating pregnancy: Secondary | ICD-10-CM | POA: Insufficient documentation

## 2019-10-24 NOTE — Progress Notes (Signed)
Patient was seen on 10/24/19 for Gestational Diabetes self-management class at the [facility]. The following learning objectives were met by the patient during this course:   States the definition of Gestational Diabetes  States why dietary management is important in controlling blood glucose  Describes the effects each nutrient has on blood glucose levels  Demonstrates ability to create a balanced meal plan  Demonstrates carbohydrate counting   States when to check blood glucose levels  Demonstrates proper blood glucose monitoring techniques  States the effect of stress and exercise on blood glucose levels  States the importance of limiting caffeine and abstaining from alcohol and smoking  Blood glucose monitor given: Accu-chek Guide Me Lot #[id number] Exp: 10/24/2020 Blood Glucose: 85 mg/dL  Patient instructed to monitor glucose levels: FBS: 60 - <95; 1 hour: <140; 2 hour: <120  Patient received handouts:  Nutrition Diabetes and Pregnancy, including carb counting list  Patient will be seen for follow-up as needed.

## 2019-11-04 ENCOUNTER — Encounter (HOSPITAL_COMMUNITY): Payer: Self-pay | Admitting: Obstetrics and Gynecology

## 2019-11-04 ENCOUNTER — Other Ambulatory Visit: Payer: Self-pay

## 2019-11-04 ENCOUNTER — Inpatient Hospital Stay (HOSPITAL_COMMUNITY)
Admission: AD | Admit: 2019-11-04 | Discharge: 2019-11-04 | Disposition: A | Discharge status: Home / Self Care | Payer: Medicaid Other | Attending: Obstetrics and Gynecology | Admitting: Obstetrics and Gynecology

## 2019-11-04 ENCOUNTER — Other Ambulatory Visit: Payer: Self-pay | Admitting: Student

## 2019-11-04 DIAGNOSIS — H538 Other visual disturbances: Secondary | ICD-10-CM | POA: Insufficient documentation

## 2019-11-04 DIAGNOSIS — O26893 Other specified pregnancy related conditions, third trimester: Secondary | ICD-10-CM | POA: Diagnosis not present

## 2019-11-04 DIAGNOSIS — R519 Headache, unspecified: Secondary | ICD-10-CM | POA: Diagnosis not present

## 2019-11-04 DIAGNOSIS — Z3A34 34 weeks gestation of pregnancy: Secondary | ICD-10-CM | POA: Diagnosis not present

## 2019-11-04 DIAGNOSIS — O99013 Anemia complicating pregnancy, third trimester: Secondary | ICD-10-CM

## 2019-11-04 DIAGNOSIS — O24419 Gestational diabetes mellitus in pregnancy, unspecified control: Secondary | ICD-10-CM | POA: Diagnosis not present

## 2019-11-04 DIAGNOSIS — Z3689 Encounter for other specified antenatal screening: Secondary | ICD-10-CM

## 2019-11-04 DIAGNOSIS — O1203 Gestational edema, third trimester: Secondary | ICD-10-CM

## 2019-11-04 HISTORY — DX: Gestational diabetes mellitus in pregnancy, unspecified control: O24.419

## 2019-11-04 LAB — COMPREHENSIVE METABOLIC PANEL
ALT: 32 U/L (ref 0–44)
AST: 40 U/L (ref 15–41)
Albumin: 2.5 g/dL — ABNORMAL LOW (ref 3.5–5.0)
Alkaline Phosphatase: 242 U/L — ABNORMAL HIGH (ref 38–126)
Anion gap: 11 (ref 5–15)
BUN: 5 mg/dL — ABNORMAL LOW (ref 6–20)
CO2: 17 mmol/L — ABNORMAL LOW (ref 22–32)
Calcium: 8.8 mg/dL — ABNORMAL LOW (ref 8.9–10.3)
Chloride: 107 mmol/L (ref 98–111)
Creatinine, Ser: 0.34 mg/dL — ABNORMAL LOW (ref 0.44–1.00)
GFR calc Af Amer: >60 mL/min (ref >60)
GFR calc non Af Amer: >60 mL/min (ref >60)
Glucose, Bld: 108 mg/dL — ABNORMAL HIGH (ref 70–99)
Potassium: 3.8 mmol/L (ref 3.5–5.1)
Sodium: 135 mmol/L (ref 135–145)
Total Bilirubin: 0.6 mg/dL (ref 0.3–1.2)
Total Protein: 6 g/dL — ABNORMAL LOW (ref 6.5–8.1)

## 2019-11-04 LAB — CBC
HCT: 26.9 % — ABNORMAL LOW (ref 36.0–46.0)
Hemoglobin: 7.8 g/dL — ABNORMAL LOW (ref 12.0–15.0)
MCH: 19.9 pg — ABNORMAL LOW (ref 26.0–34.0)
MCHC: 29 g/dL — ABNORMAL LOW (ref 30.0–36.0)
MCV: 68.8 fL — ABNORMAL LOW (ref 80.0–100.0)
Platelets: 253 10*3/uL (ref 150–400)
RBC: 3.91 MIL/uL (ref 3.87–5.11)
RDW: 19.2 % — ABNORMAL HIGH (ref 11.5–15.5)
WBC: 7.2 10*3/uL (ref 4.0–10.5)
nRBC: 0.4 % — ABNORMAL HIGH (ref 0.0–0.2)

## 2019-11-04 LAB — URINALYSIS, ROUTINE W REFLEX MICROSCOPIC
Bilirubin Urine: NEGATIVE
Glucose, UA: >=500 mg/dL — AB
Hgb urine dipstick: NEGATIVE
Ketones, ur: NEGATIVE mg/dL
Leukocytes,Ua: NEGATIVE
Nitrite: NEGATIVE
Protein, ur: 30 mg/dL — AB
Specific Gravity, Urine: 1.021 (ref 1.005–1.030)
pH: 6 (ref 5.0–8.0)

## 2019-11-04 LAB — PROTEIN / CREATININE RATIO, URINE
Creatinine, Urine: 111.22 mg/dL
Protein Creatinine Ratio: 0.13 mg/mg{Cre} (ref 0.00–0.15)
Total Protein, Urine: 14 mg/dL

## 2019-11-04 LAB — GLUCOSE, CAPILLARY: Glucose-Capillary: 102 mg/dL — ABNORMAL HIGH (ref 70–99)

## 2019-11-04 MED ORDER — LACTATED RINGERS IV BOLUS
1000.0000 mL | Freq: Once | INTRAVENOUS | Status: AC
  Administered 2019-11-04: 1000 mL via INTRAVENOUS

## 2019-11-04 MED ORDER — METOCLOPRAMIDE HCL 5 MG/ML IJ SOLN
10.0000 mg | Freq: Once | INTRAMUSCULAR | Status: AC
  Administered 2019-11-04: 20:00:00 10 mg via INTRAVENOUS
  Filled 2019-11-04: qty 2

## 2019-11-04 MED ORDER — METOCLOPRAMIDE HCL 10 MG PO TABS
10.0000 mg | ORAL_TABLET | Freq: Once | ORAL | Status: AC
  Administered 2019-11-04: 10 mg via ORAL
  Filled 2019-11-04: qty 1

## 2019-11-04 MED ORDER — SODIUM CHLORIDE 0.9 % IV SOLN
510.0000 mg | Freq: Once | INTRAVENOUS | Status: AC
  Administered 2019-11-04: 510 mg via INTRAVENOUS
  Filled 2019-11-04: qty 17

## 2019-11-04 MED ORDER — SODIUM CHLORIDE 0.9 % IV SOLN: INTRAVENOUS | Status: DC

## 2019-11-04 MED ORDER — LACTATED RINGERS IV SOLN: INTRAVENOUS | Status: DC

## 2019-11-04 MED ORDER — BUTALBITAL-APAP-CAFFEINE 50-325-40 MG PO TABS
2.0000 | ORAL_TABLET | Freq: Once | ORAL | Status: AC
  Administered 2019-11-04: 2 via ORAL
  Filled 2019-11-04: qty 2

## 2019-11-04 MED ORDER — SODIUM CHLORIDE 0.9 % IV SOLN: 510.0000 mg | Freq: Once | INTRAVENOUS | Status: DC

## 2019-11-04 MED ORDER — DIPHENHYDRAMINE HCL 50 MG/ML IJ SOLN
12.5000 mg | Freq: Once | INTRAMUSCULAR | Status: AC
  Administered 2019-11-04: 12.5 mg via INTRAVENOUS
  Filled 2019-11-04: qty 1

## 2019-11-04 MED ORDER — DEXAMETHASONE SODIUM PHOSPHATE 10 MG/ML IJ SOLN
10.0000 mg | Freq: Once | INTRAMUSCULAR | Status: AC
  Administered 2019-11-04: 10 mg via INTRAVENOUS
  Filled 2019-11-04: qty 1

## 2019-11-04 NOTE — Discharge Instructions (Signed)

## 2019-11-04 NOTE — MAU Provider Note (Addendum)
Patient Janet Mckee is a 24 y.o. G3P2002  at [redacted]w[redacted]d here with many complaints: blurry vision yesterday, swelling in her feet and leg pain and a headache. She feels that her fingers are "super swollen".   She denies vaginal bleeding, decreased fetal movements, LOF, dysuria.  She feels like her urine output has decreased as well over the past 24 hours. She did not urinate at all last night, which is unusual for her. She says that she drinks water "24/7".   Patient is a gestational diabetic; she is supposed to start metformin this week (RX has been ordered but she has not picked it up yet).  History     CSN: [id number]  Arrival date and time: 11/04/19 1635   First Provider Initiated Contact with Patient 11/04/19 1743      Chief Complaint  Patient presents with  . Headache  . Swelling   Headache  This is a new problem. The current episode started yesterday. Progression since onset: started last night. She slept and then she said it got worse today.  The pain is at a severity of 8/10. Associated symptoms include blurred vision. Pertinent negatives include no vomiting. Associated symptoms comments: She had blurred vision that she noticed in the middle of the night. She also had blurry vision this morning. However, after taking Tylenol she started to feel better. But now she feels worse again and decided to come in. . Nothing aggravates the symptoms. She has tried acetaminophen for the symptoms.   When she had her blurry vision, she checked her sugar.  Blood sugar yesterday was  0824: 96 1249: 103 2249: 102  Today her sugars were  1013: 92 1745: 102  She feels like her hands and feet are very swollen. She felt like she could not bend her fingers. She wants to make sure everything is ok.   OB History    Gravida  3   Para  2   Term  2   Preterm  0   AB  0   Living  2     SAB      TAB      Ectopic      Multiple      Live Births  2           Past Medical  History:  Diagnosis Date  . Depression    PP with 2nd child, ok child  . Gestational diabetes   . Infection    UTI  . Ovarian cyst     Past Surgical History:  Procedure Laterality Date  . KNEE SURGERY Bilateral    patellar realignment    Family History  Problem Relation Age of Onset  . Anemia Mother     Social History   Tobacco Use  . Smoking status: Never Smoker  . Smokeless tobacco: Never Used  Substance Use Topics  . Alcohol use: Never  . Drug use: Never    Allergies:  Allergies  Allergen Reactions  . Sulfa Antibiotics Hives    Medications Prior to Admission  Medication Sig Dispense Refill Last Dose  . docusate sodium (COLACE) 100 MG capsule Take 100 mg by mouth daily.     . metoCLOPramide (REGLAN) 10 MG tablet Take 1 tablet (10 mg total) by mouth every 8 (eight) hours as needed for nausea. 30 tablet 0   . polyethylene glycol (MIRALAX / GLYCOLAX) 17 g packet Take 17 g by mouth daily.     . Prenatal Vit-Fe Fumarate-FA (MULTIVITAMIN-PRENATAL) 27-0.8  MG TABS tablet Take 1 tablet by mouth daily at 12 noon.       Review of Systems  Eyes: Positive for blurred vision.  Gastrointestinal: Negative for vomiting.  Neurological: Positive for headaches.   Physical Exam   Blood pressure 128/73, pulse (!) 109, temperature 98.7 F (37.1 C), temperature source Oral, resp. rate 16, height 5\' 2"  (1.575 m), weight 86.4 kg, SpO2 100 %.  Physical Exam  Constitutional: She appears well-developed.  HENT:  Head: Normocephalic.  Respiratory: Effort normal.  GI: Soft. She exhibits no distension. There is no abdominal tenderness.  Musculoskeletal:        General: Edema present. Normal range of motion.     Cervical back: Normal range of motion.     Comments: Pedal edema but no pitting. No edema noted on wrists or on eyelids.   Neurological: She is alert.  Skin: Skin is warm.    MAU Course  Procedures  MDM -NST: 150 bpm, mod var, present acel, neg decels, no  contractions -Patient had 2 Fioricet but feels that her headache is worse. Will try IV HA cocktail.  -2015: patient reporting that patient does not feel better; her head still hurts. CBC shows that her hemoglobin is now 7.2. Review of past records shows that her Hgb at 18 weeks was 11, at 28 weeks it was 8 and today it is 7.2. Will do Feraheme infusion now since patient is symptomatic (dizziness, headache not relieved with antiemetics)   -Feraheme outpatient ordered; patient given number to call and follow-up.  -[facility] provider made aware of patient's follow-up plan.  Patient care endorsed to [facility] [place] at 2128  Assessment and Plan     [place] 11/04/2019, 5:48 PM   Reassessment (10:58 PM) -Nurse reports Feraheme infusion complete and patient has been monitored for 30 minutes with no s/s of reaction. -Discharge to home in stable condition. -Patient to follow up with primary provider as scheduled.   Maryann Conners MSN, CNM Advanced Practice Provider, [facility]

## 2019-11-04 NOTE — MAU Note (Signed)
Janet Mckee is a 24 y.o. at [redacted]w[redacted]d here in MAU reporting: hands and feet have increased in swelling for the past couple days. Also having headache and blurry vision. Tried tylenol and it didn't help. No LOF or VB. +FM  Onset of complaint: yesterday  Pain score: 8/10  Vitals:   11/04/19 1655  BP: 126/70  Pulse: (!) 115  Resp: 16  Temp: 98.7 F (37.1 C)  SpO2: 100%     FHT: +FM  Lab orders placed from triage: UA

## 2019-11-14 ENCOUNTER — Telehealth: Payer: Self-pay | Admitting: Hematology

## 2019-11-14 NOTE — Telephone Encounter (Signed)
Received a new hem referral from Nigel Bridgeman at [facility] for anemia. Pt has been cld and scheduled to see Dr. Candise Che on 4/6 at 11am. Pt aware to arrive 15 minutes early.

## 2019-11-23 ENCOUNTER — Encounter (HOSPITAL_COMMUNITY): Payer: Self-pay

## 2019-11-23 NOTE — Patient Instructions (Addendum)
Juliann Olesky  11/23/2019   Your procedure is scheduled on:  11/26/2019  Arrive at 0900 at Entrance C on [address] at [facility]  and [employer]. You are invited to use the FREE valet parking or use the Visitor's parking deck.  Pick up the phone at the desk and dial [phone].  Call this number if you have problems the morning of surgery: [phone]  Remember:   Do not eat food:(After Midnight) Desps de medianoche.  Do not drink clear liquids: (After Midnight) Desps de medianoche.  Take these medicines the morning of surgery with A SIP OF WATER:  Take 6 units of insulin the night before surgery.  No meds the day of surgery   Do not wear jewelry, make-up or nail polish.  Do not wear lotions, powders, or perfumes. Do not wear deodorant.  Do not shave 48 hours prior to surgery.  Do not bring valuables to the hospital.  [facility] is not   responsible for any belongings or valuables brought to the hospital.  Contacts, dentures or bridgework may not be worn into surgery.  Leave suitcase in the car. After surgery it may be brought to your room.  For patients admitted to the hospital, checkout time is 11:00 AM the day of              discharge.      Please read over the following fact sheets that you were given:     Preparing for Surgery

## 2019-11-24 ENCOUNTER — Other Ambulatory Visit: Payer: Self-pay

## 2019-11-24 ENCOUNTER — Other Ambulatory Visit (HOSPITAL_COMMUNITY)
Admission: RE | Admit: 2019-11-24 | Discharge: 2019-11-24 | Disposition: A | Discharge status: Home / Self Care | Payer: Medicaid Other | Source: Ambulatory Visit | Attending: Obstetrics and Gynecology | Admitting: Obstetrics and Gynecology

## 2019-11-24 DIAGNOSIS — Z01812 Encounter for preprocedural laboratory examination: Secondary | ICD-10-CM | POA: Insufficient documentation

## 2019-11-24 DIAGNOSIS — Z20822 Contact with and (suspected) exposure to covid-19: Secondary | ICD-10-CM | POA: Diagnosis not present

## 2019-11-24 LAB — SARS CORONAVIRUS 2 (TAT 6-24 HRS): SARS Coronavirus 2: NEGATIVE

## 2019-11-24 LAB — ABO/RH: ABO/RH(D): O POS

## 2019-11-24 NOTE — MAU Note (Signed)
Pt here for covid swab and lab work. Denies symptoms and sick contacts. Swab collected. 

## 2019-11-24 NOTE — MAU Note (Signed)
Phlebotomy informed RN that she stuck pt 2x and was only able to draw enough blood for T/S. Will send that to blood bank and pt will need to have other blood work drawn with IV start prior to c/s

## 2019-11-26 ENCOUNTER — Inpatient Hospital Stay (HOSPITAL_COMMUNITY): Payer: Medicaid Other | Admitting: Anesthesiology

## 2019-11-26 ENCOUNTER — Other Ambulatory Visit: Payer: Self-pay

## 2019-11-26 ENCOUNTER — Inpatient Hospital Stay (HOSPITAL_COMMUNITY)
Admission: RE | Admit: 2019-11-26 | Discharge: 2019-11-28 | DRG: 788 | Disposition: A | Discharge status: Home / Self Care | Payer: Medicaid Other | Attending: Obstetrics & Gynecology | Admitting: Obstetrics & Gynecology

## 2019-11-26 ENCOUNTER — Encounter (HOSPITAL_COMMUNITY): Payer: Self-pay | Admitting: Obstetrics & Gynecology

## 2019-11-26 ENCOUNTER — Encounter (HOSPITAL_COMMUNITY)
Admission: RE | Disposition: A | Discharge status: Home / Self Care | Payer: Self-pay | Source: Home / Self Care | Attending: Obstetrics & Gynecology

## 2019-11-26 DIAGNOSIS — E669 Obesity, unspecified: Secondary | ICD-10-CM | POA: Diagnosis present

## 2019-11-26 DIAGNOSIS — O99214 Obesity complicating childbirth: Secondary | ICD-10-CM | POA: Diagnosis present

## 2019-11-26 DIAGNOSIS — Z3A37 37 weeks gestation of pregnancy: Secondary | ICD-10-CM

## 2019-11-26 DIAGNOSIS — O99824 Streptococcus B carrier state complicating childbirth: Secondary | ICD-10-CM | POA: Diagnosis present

## 2019-11-26 DIAGNOSIS — O34211 Maternal care for low transverse scar from previous cesarean delivery: Secondary | ICD-10-CM | POA: Diagnosis present

## 2019-11-26 DIAGNOSIS — Z98891 History of uterine scar from previous surgery: Secondary | ICD-10-CM

## 2019-11-26 DIAGNOSIS — O24424 Gestational diabetes mellitus in childbirth, insulin controlled: Principal | ICD-10-CM | POA: Diagnosis present

## 2019-11-26 DIAGNOSIS — O3663X0 Maternal care for excessive fetal growth, third trimester, not applicable or unspecified: Secondary | ICD-10-CM | POA: Diagnosis present

## 2019-11-26 LAB — CBC
HCT: 35 % — ABNORMAL LOW (ref 36.0–46.0)
Hemoglobin: 10.1 g/dL — ABNORMAL LOW (ref 12.0–15.0)
MCH: 22.7 pg — ABNORMAL LOW (ref 26.0–34.0)
MCHC: 28.9 g/dL — ABNORMAL LOW (ref 30.0–36.0)
MCV: 78.7 fL — ABNORMAL LOW (ref 80.0–100.0)
Platelets: 229 10*3/uL (ref 150–400)
RBC: 4.45 MIL/uL (ref 3.87–5.11)
RDW: 30.3 % — ABNORMAL HIGH (ref 11.5–15.5)
WBC: 5 10*3/uL (ref 4.0–10.5)
nRBC: 0 % (ref 0.0–0.2)

## 2019-11-26 LAB — GLUCOSE, CAPILLARY
Glucose-Capillary: 95 mg/dL (ref 70–99)
Glucose-Capillary: 71 mg/dL (ref 70–99)
Glucose-Capillary: 56 mg/dL — ABNORMAL LOW (ref 70–99)
Glucose-Capillary: 83 mg/dL (ref 70–99)

## 2019-11-26 LAB — RPR: RPR Ser Ql: NONREACTIVE

## 2019-11-26 LAB — PREPARE RBC (CROSSMATCH)

## 2019-11-26 SURGERY — Surgical Case
Anesthesia: Spinal | Laterality: Bilateral | Wound class: Clean

## 2019-11-26 MED ORDER — CEFAZOLIN SODIUM-DEXTROSE 2-3 GM-%(50ML) IV SOLR
INTRAVENOUS | Status: DC | PRN
  Administered 2019-11-26: 2 g via INTRAVENOUS

## 2019-11-26 MED ORDER — NALOXONE HCL 4 MG/10ML IJ SOLN
1.0000 ug/kg/h | INTRAVENOUS | Status: DC | PRN
  Filled 2019-11-26: qty 5

## 2019-11-26 MED ORDER — PRENATAL MULTIVITAMIN CH
1.0000 | ORAL_TABLET | Freq: Every day | ORAL | Status: DC
  Administered 2019-11-27 – 2019-11-28 (×2): 1 via ORAL
  Filled 2019-11-26 (×2): qty 1

## 2019-11-26 MED ORDER — SENNOSIDES-DOCUSATE SODIUM 8.6-50 MG PO TABS
2.0000 | ORAL_TABLET | ORAL | Status: DC
  Administered 2019-11-26 – 2019-11-27 (×2): 2 via ORAL
  Filled 2019-11-26 (×2): qty 2

## 2019-11-26 MED ORDER — OXYTOCIN 40 UNITS IN NORMAL SALINE INFUSION - SIMPLE MED: 2.5000 [IU]/h | INTRAVENOUS | Status: AC

## 2019-11-26 MED ORDER — SIMETHICONE 80 MG PO CHEW
80.0000 mg | CHEWABLE_TABLET | ORAL | Status: DC
  Administered 2019-11-26 – 2019-11-27 (×2): 80 mg via ORAL
  Filled 2019-11-26 (×2): qty 1

## 2019-11-26 MED ORDER — ZOLPIDEM TARTRATE 5 MG PO TABS: 5.0000 mg | ORAL_TABLET | Freq: Every evening | ORAL | Status: DC | PRN

## 2019-11-26 MED ORDER — DIBUCAINE (PERIANAL) 1 % EX OINT: 1.0000 "application " | TOPICAL_OINTMENT | CUTANEOUS | Status: DC | PRN

## 2019-11-26 MED ORDER — BUPIVACAINE IN DEXTROSE 0.75-8.25 % IT SOLN
INTRATHECAL | Status: DC | PRN
  Administered 2019-11-26: 1.6 mL via INTRATHECAL

## 2019-11-26 MED ORDER — ACETAMINOPHEN 500 MG PO TABS
1000.0000 mg | ORAL_TABLET | Freq: Four times a day (QID) | ORAL | Status: DC
  Administered 2019-11-26 – 2019-11-28 (×6): 1000 mg via ORAL
  Filled 2019-11-26 (×7): qty 2

## 2019-11-26 MED ORDER — DIPHENHYDRAMINE HCL 25 MG PO CAPS: 25.0000 mg | ORAL_CAPSULE | ORAL | Status: DC | PRN

## 2019-11-26 MED ORDER — OXYTOCIN 10 UNIT/ML IJ SOLN
INTRAMUSCULAR | Status: DC | PRN
  Administered 2019-11-26: 40 [IU]

## 2019-11-26 MED ORDER — METOCLOPRAMIDE HCL 5 MG/ML IJ SOLN
INTRAMUSCULAR | Status: AC
  Filled 2019-11-26: qty 2

## 2019-11-26 MED ORDER — TETANUS-DIPHTH-ACELL PERTUSSIS 5-2.5-18.5 LF-MCG/0.5 IM SUSP: 0.5000 mL | Freq: Once | INTRAMUSCULAR | Status: DC

## 2019-11-26 MED ORDER — BUPIVACAINE HCL 0.5 % IJ SOLN
INTRAMUSCULAR | Status: DC | PRN
  Administered 2019-11-26: 30 mL

## 2019-11-26 MED ORDER — PHENYLEPHRINE HCL (PRESSORS) 10 MG/ML IV SOLN
INTRAVENOUS | Status: DC | PRN
  Administered 2019-11-26: 40 ug via INTRAVENOUS

## 2019-11-26 MED ORDER — MORPHINE SULFATE (PF) 0.5 MG/ML IJ SOLN
INTRAMUSCULAR | Status: DC | PRN
  Administered 2019-11-26: .15 mg via INTRATHECAL

## 2019-11-26 MED ORDER — OXYCODONE-ACETAMINOPHEN 5-325 MG PO TABS
2.0000 | ORAL_TABLET | ORAL | Status: DC | PRN
  Administered 2019-11-28: 2 via ORAL
  Filled 2019-11-26: qty 2

## 2019-11-26 MED ORDER — IBUPROFEN 800 MG PO TABS
800.0000 mg | ORAL_TABLET | Freq: Four times a day (QID) | ORAL | Status: DC
  Administered 2019-11-27 – 2019-11-28 (×4): 800 mg via ORAL
  Filled 2019-11-26 (×4): qty 1

## 2019-11-26 MED ORDER — LACTATED RINGERS IV SOLN: INTRAVENOUS | Status: DC

## 2019-11-26 MED ORDER — METOCLOPRAMIDE HCL 5 MG/ML IJ SOLN
INTRAMUSCULAR | Status: DC | PRN
  Administered 2019-11-26: 10 mg via INTRAVENOUS

## 2019-11-26 MED ORDER — PROPOFOL 10 MG/ML IV BOLUS
INTRAVENOUS | Status: DC | PRN
  Administered 2019-11-26 (×2): 10 mg via INTRAVENOUS

## 2019-11-26 MED ORDER — CEFAZOLIN SODIUM-DEXTROSE 2-4 GM/100ML-% IV SOLN: 2.0000 g | INTRAVENOUS | Status: DC

## 2019-11-26 MED ORDER — SOD CITRATE-CITRIC ACID 500-334 MG/5ML PO SOLN
ORAL | Status: AC
  Filled 2019-11-26: qty 30

## 2019-11-26 MED ORDER — WITCH HAZEL-GLYCERIN EX PADS: 1.0000 "application " | MEDICATED_PAD | CUTANEOUS | Status: DC | PRN

## 2019-11-26 MED ORDER — CEFAZOLIN SODIUM-DEXTROSE 2-4 GM/100ML-% IV SOLN
INTRAVENOUS | Status: AC
  Filled 2019-11-26: qty 100

## 2019-11-26 MED ORDER — PROMETHAZINE HCL 25 MG/ML IJ SOLN
6.2500 mg | Freq: Once | INTRAMUSCULAR | Status: AC
  Administered 2019-11-26: 15:00:00 6.25 mg via INTRAVENOUS

## 2019-11-26 MED ORDER — FENTANYL CITRATE (PF) 100 MCG/2ML IJ SOLN: 25.0000 ug | INTRAMUSCULAR | Status: DC | PRN

## 2019-11-26 MED ORDER — SIMETHICONE 80 MG PO CHEW: 80.0000 mg | CHEWABLE_TABLET | ORAL | Status: DC | PRN

## 2019-11-26 MED ORDER — BUPIVACAINE HCL (PF) 0.5 % IJ SOLN
INTRAMUSCULAR | Status: AC
  Filled 2019-11-26: qty 30

## 2019-11-26 MED ORDER — COCONUT OIL OIL: 1.0000 "application " | TOPICAL_OIL | Status: DC | PRN

## 2019-11-26 MED ORDER — NALBUPHINE HCL 10 MG/ML IJ SOLN: 5.0000 mg | Freq: Once | INTRAMUSCULAR | Status: DC | PRN

## 2019-11-26 MED ORDER — DEXAMETHASONE SODIUM PHOSPHATE 4 MG/ML IJ SOLN
INTRAMUSCULAR | Status: AC
  Filled 2019-11-26: qty 7

## 2019-11-26 MED ORDER — SCOPOLAMINE 1 MG/3DAYS TD PT72
MEDICATED_PATCH | TRANSDERMAL | Status: AC
  Filled 2019-11-26: qty 1

## 2019-11-26 MED ORDER — PROMETHAZINE HCL 25 MG/ML IJ SOLN: 6.2500 mg | INTRAMUSCULAR | Status: DC | PRN

## 2019-11-26 MED ORDER — PROPOFOL 10 MG/ML IV BOLUS
INTRAVENOUS | Status: AC
  Filled 2019-11-26: qty 20

## 2019-11-26 MED ORDER — MENTHOL 3 MG MT LOZG: 1.0000 | LOZENGE | OROMUCOSAL | Status: DC | PRN

## 2019-11-26 MED ORDER — OXYTOCIN 40 UNITS IN NORMAL SALINE INFUSION - SIMPLE MED
INTRAVENOUS | Status: AC
  Filled 2019-11-26: qty 1000

## 2019-11-26 MED ORDER — PROMETHAZINE HCL 25 MG/ML IJ SOLN
INTRAMUSCULAR | Status: AC
  Filled 2019-11-26: qty 1

## 2019-11-26 MED ORDER — MEPERIDINE HCL 25 MG/ML IJ SOLN
INTRAMUSCULAR | Status: AC
  Filled 2019-11-26: qty 1

## 2019-11-26 MED ORDER — SOD CITRATE-CITRIC ACID 500-334 MG/5ML PO SOLN
30.0000 mL | ORAL | Status: AC
  Administered 2019-11-26: 30 mL via ORAL

## 2019-11-26 MED ORDER — DIPHENHYDRAMINE HCL 25 MG PO CAPS: 25.0000 mg | ORAL_CAPSULE | Freq: Four times a day (QID) | ORAL | Status: DC | PRN

## 2019-11-26 MED ORDER — SIMETHICONE 80 MG PO CHEW
80.0000 mg | CHEWABLE_TABLET | Freq: Three times a day (TID) | ORAL | Status: DC
  Administered 2019-11-26 – 2019-11-28 (×5): 80 mg via ORAL
  Filled 2019-11-26 (×5): qty 1

## 2019-11-26 MED ORDER — ONDANSETRON HCL 4 MG/2ML IJ SOLN
INTRAMUSCULAR | Status: DC | PRN
  Administered 2019-11-26: 4 mg via INTRAVENOUS

## 2019-11-26 MED ORDER — KETOROLAC TROMETHAMINE 30 MG/ML IJ SOLN
30.0000 mg | Freq: Four times a day (QID) | INTRAMUSCULAR | Status: AC | PRN
  Administered 2019-11-26: 30 mg via INTRAVENOUS

## 2019-11-26 MED ORDER — DEXAMETHASONE SODIUM PHOSPHATE 4 MG/ML IJ SOLN
INTRAMUSCULAR | Status: DC | PRN
  Administered 2019-11-26: 4 mg via INTRAVENOUS

## 2019-11-26 MED ORDER — LACTATED RINGERS IV SOLN: INTRAVENOUS | Status: DC | PRN

## 2019-11-26 MED ORDER — ONDANSETRON HCL 4 MG/2ML IJ SOLN
INTRAMUSCULAR | Status: AC
  Filled 2019-11-26: qty 2

## 2019-11-26 MED ORDER — FENTANYL CITRATE (PF) 100 MCG/2ML IJ SOLN
50.0000 ug | INTRAMUSCULAR | Status: DC | PRN
  Administered 2019-11-26 (×2): 50 ug via INTRAVENOUS
  Filled 2019-11-26 (×2): qty 2

## 2019-11-26 MED ORDER — PHENYLEPHRINE HCL-NACL 20-0.9 MG/250ML-% IV SOLN
INTRAVENOUS | Status: AC
  Filled 2019-11-26: qty 250

## 2019-11-26 MED ORDER — KETOROLAC TROMETHAMINE 30 MG/ML IJ SOLN
30.0000 mg | Freq: Four times a day (QID) | INTRAMUSCULAR | Status: AC
  Administered 2019-11-26 – 2019-11-27 (×3): 30 mg via INTRAVENOUS
  Filled 2019-11-26 (×3): qty 1

## 2019-11-26 MED ORDER — PHENYLEPHRINE 40 MCG/ML (10ML) SYRINGE FOR IV PUSH (FOR BLOOD PRESSURE SUPPORT)
PREFILLED_SYRINGE | INTRAVENOUS | Status: AC
  Filled 2019-11-26: qty 10

## 2019-11-26 MED ORDER — SODIUM CHLORIDE 0.9% FLUSH: 3.0000 mL | INTRAVENOUS | Status: DC | PRN

## 2019-11-26 MED ORDER — PHENYLEPHRINE HCL-NACL 20-0.9 MG/250ML-% IV SOLN
INTRAVENOUS | Status: DC | PRN
  Administered 2019-11-26: 60 ug/min via INTRAVENOUS

## 2019-11-26 MED ORDER — MEPERIDINE HCL 25 MG/ML IJ SOLN
INTRAMUSCULAR | Status: DC | PRN
  Administered 2019-11-26: 12.5 mg via INTRAVENOUS

## 2019-11-26 MED ORDER — ONDANSETRON HCL 4 MG/2ML IJ SOLN: 4.0000 mg | Freq: Three times a day (TID) | INTRAMUSCULAR | Status: DC | PRN

## 2019-11-26 MED ORDER — OXYCODONE-ACETAMINOPHEN 5-325 MG PO TABS
1.0000 | ORAL_TABLET | ORAL | Status: DC | PRN
  Administered 2019-11-27 – 2019-11-28 (×2): 1 via ORAL
  Filled 2019-11-26 (×2): qty 1

## 2019-11-26 MED ORDER — SODIUM CHLORIDE 0.9 % IV SOLN: INTRAVENOUS | Status: DC | PRN

## 2019-11-26 MED ORDER — KETOROLAC TROMETHAMINE 30 MG/ML IJ SOLN: 30.0000 mg | Freq: Four times a day (QID) | INTRAMUSCULAR | Status: AC | PRN

## 2019-11-26 MED ORDER — MORPHINE SULFATE (PF) 0.5 MG/ML IJ SOLN
INTRAMUSCULAR | Status: AC
  Filled 2019-11-26: qty 10

## 2019-11-26 MED ORDER — KETOROLAC TROMETHAMINE 30 MG/ML IJ SOLN
INTRAMUSCULAR | Status: AC
  Filled 2019-11-26: qty 1

## 2019-11-26 MED ORDER — NALOXONE HCL 0.4 MG/ML IJ SOLN: 0.4000 mg | INTRAMUSCULAR | Status: DC | PRN

## 2019-11-26 MED ORDER — FENTANYL CITRATE (PF) 100 MCG/2ML IJ SOLN
INTRAMUSCULAR | Status: AC
  Filled 2019-11-26: qty 2

## 2019-11-26 MED ORDER — MEPERIDINE HCL 25 MG/ML IJ SOLN: 6.2500 mg | INTRAMUSCULAR | Status: DC | PRN

## 2019-11-26 MED ORDER — FENTANYL CITRATE (PF) 100 MCG/2ML IJ SOLN
INTRAMUSCULAR | Status: DC | PRN
  Administered 2019-11-26: 15 ug via INTRATHECAL

## 2019-11-26 MED ORDER — DIPHENHYDRAMINE HCL 50 MG/ML IJ SOLN: 12.5000 mg | INTRAMUSCULAR | Status: DC | PRN

## 2019-11-26 SURGICAL SUPPLY — 36 items
BENZOIN TINCTURE PRP APPL 2/3 (GAUZE/BANDAGES/DRESSINGS) ×3 IMPLANT
CLAMP CORD UMBIL (MISCELLANEOUS) IMPLANT
CLOSURE WOUND 1/2 X4 (GAUZE/BANDAGES/DRESSINGS) ×1
CLOTH BEACON ORANGE TIMEOUT ST (SAFETY) ×3 IMPLANT
DRSG OPSITE POSTOP 4X10 (GAUZE/BANDAGES/DRESSINGS) ×3 IMPLANT
ELECT REM PT RETURN 9FT ADLT (ELECTROSURGICAL) ×3
ELECTRODE REM PT RTRN 9FT ADLT (ELECTROSURGICAL) ×1 IMPLANT
GLOVE BIO SURGEON STRL SZ 6.5 (GLOVE) ×2 IMPLANT
GLOVE BIO SURGEONS STRL SZ 6.5 (GLOVE) ×1
GLOVE BIOGEL PI IND STRL 7.0 (GLOVE) ×2 IMPLANT
GLOVE BIOGEL PI INDICATOR 7.0 (GLOVE) ×4
GOWN STRL REUS W/TWL LRG LVL3 (GOWN DISPOSABLE) ×9 IMPLANT
KIT ABG SYR 3ML LUER SLIP (SYRINGE) IMPLANT
NEEDLE HYPO 22GX1.5 SAFETY (NEEDLE) ×3 IMPLANT
NEEDLE HYPO 25X5/8 SAFETYGLIDE (NEEDLE) IMPLANT
NS IRRIG 1000ML POUR BTL (IV SOLUTION) ×3 IMPLANT
PACK C SECTION WH (CUSTOM PROCEDURE TRAY) ×3 IMPLANT
PAD OB MATERNITY 4.3X12.25 (PERSONAL CARE ITEMS) ×3 IMPLANT
PENCIL SMOKE EVAC W/HOLSTER (ELECTROSURGICAL) ×3 IMPLANT
RTRCTR C-SECT PINK 25CM LRG (MISCELLANEOUS) ×3 IMPLANT
STRIP CLOSURE SKIN 1/2X4 (GAUZE/BANDAGES/DRESSINGS) ×2 IMPLANT
SUT CHROMIC 2 0 CT 1 (SUTURE) ×6 IMPLANT
SUT MNCRL 0 VIOLET CTX 36 (SUTURE) ×2 IMPLANT
SUT MONOCRYL 0 CTX 36 (SUTURE) ×4
SUT PDS AB 0 CTX 60 (SUTURE) ×3 IMPLANT
SUT PLAIN 2 0 (SUTURE) ×2
SUT PLAIN ABS 2-0 CT1 27XMFL (SUTURE) ×1 IMPLANT
SUT VIC AB 0 CT1 27 (SUTURE) ×2
SUT VIC AB 0 CT1 27XBRD ANBCTR (SUTURE) ×1 IMPLANT
SUT VIC AB 0 CTX 36 (SUTURE) ×4
SUT VIC AB 0 CTX36XBRD ANBCTRL (SUTURE) ×2 IMPLANT
SUT VIC AB 4-0 KS 27 (SUTURE) ×3 IMPLANT
SYR CONTROL 10ML LL (SYRINGE) ×3 IMPLANT
TOWEL OR 17X24 6PK STRL BLUE (TOWEL DISPOSABLE) ×3 IMPLANT
TRAY FOLEY W/BAG SLVR 14FR LF (SET/KITS/TRAYS/PACK) ×3 IMPLANT
WATER STERILE IRR 1000ML POUR (IV SOLUTION) ×3 IMPLANT

## 2019-11-26 NOTE — Anesthesia Postprocedure Evaluation (Signed)
Anesthesia Post Note  Patient: Donyell Carrell  Procedure(s) Performed: Cesarean section  (Bilateral )     Patient location during evaluation: PACU Anesthesia Type: Spinal Level of consciousness: oriented and awake and alert Pain management: pain level controlled Vital Signs Assessment: post-procedure vital signs reviewed and stable Respiratory status: spontaneous breathing, respiratory function stable and nonlabored ventilation Cardiovascular status: blood pressure returned to baseline and stable Postop Assessment: no headache, no backache, no apparent nausea or vomiting, spinal receding and patient able to bend at knees Anesthetic complications: no    Last Vitals:  Vitals:   11/26/19 1515 11/26/19 1530  BP: 128/70 124/77  Pulse: 87 86  Resp: (!) 28 (!) 25  Temp:    SpO2: 99% 100%    Last Pain:  Vitals:   11/26/19 1431  TempSrc: Oral  PainSc: 0-No pain   Pain Goal: Patients Stated Pain Goal: 5 (11/26/19 0858)                 Sacred Roa A.

## 2019-11-26 NOTE — Anesthesia Procedure Notes (Signed)
Spinal  Patient location during procedure: OR Start time: 11/26/2019 12:58 PM End time: 11/26/2019 1:03 PM Staffing Performed: anesthesiologist  Anesthesiologist: Mal Amabile, MD Preanesthetic Checklist Completed: patient identified, IV checked, site marked, risks and benefits discussed, surgical consent, monitors and equipment checked, pre-op evaluation and timeout performed Spinal Block Patient position: sitting Prep: DuraPrep and site prepped and draped Patient monitoring: heart rate, cardiac monitor, continuous pulse ox and blood pressure Approach: midline Location: L3-4 Injection technique: single-shot Needle Needle type: Pencan  Needle gauge: 24 G Needle length: 9 cm Needle insertion depth: 7 cm Assessment Sensory level: T4 Additional Notes Patient tolerated procedure well. Adequate sensory level.

## 2019-11-26 NOTE — H&P (Signed)
Janet Mckee is a 24 y.o. female presenting for primary CS at 37.2 weeks due to poorly controlled GDM and extensive lower extremity surgeries affecting vaginal delivery. Pt desires BTL. . OB History    Gravida  3   Para  2   Term  2   Preterm  0   AB  0   Living  2     SAB      TAB      Ectopic      Multiple      Live Births  2          Past Medical History:  Diagnosis Date  . Depression    PP with 2nd child, ok child  . Gestational diabetes   . Infection    UTI  . Ovarian cyst    Past Surgical History:  Procedure Laterality Date  . KNEE SURGERY Bilateral    patellar realignment  . TONSILLECTOMY     Family History: family history includes Anemia in her mother. Social History:  reports that she has never smoked. She has never used smokeless tobacco. She reports that she does not drink alcohol or use drugs.     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   HENT: Negative.   Eyes: Negative.   Respiratory: Negative.   Cardiovascular: Negative.   Gastrointestinal: Negative.   Endocrine: Negative.   Genitourinary: Negative.   Musculoskeletal: Negative.   Skin: Negative.   Neurological: Negative.   Hematological: Negative.   Psychiatric/Behavioral: Negative.    History   Blood pressure (!) 135/98, pulse (!) 116, temperature 98.3 F (36.8 C), temperature source Oral, resp. rate 20, height 5\' 2"  (1.575 m), weight 90.7 kg, SpO2 100 %. Exam Physical Exam  Constitutional: She is oriented to person, place, and time. She appears well-developed and well-nourished.  HENT:  Head:  Normocephalic.  Eyes: Pupils are equal, round, and reactive to light. EOM are normal.  Cardiovascular: Normal rate.  Respiratory: Effort normal.  GI: Soft.  Musculoskeletal:        General: Normal range of motion.     Cervical back: Normal range of motion.  Neurological: She is alert and oriented to person, place, and time.  Skin: Skin is warm and dry.  Psychiatric: She has a normal mood and affect. Her behavior is normal. Judgment and thought content normal.    Prenatal labs: ABO, Rh: --/--/O POS, O POS Performed at [facility], [address] [place], [place]  [phone] (84/53) Antibody: NEG (04/03 04-13-1986) Rubella: Immune (10/27 0000) RPR: Nonreactive (10/27 0000)  HBsAg: Negative (10/27 0000)  HIV: Non-reactive (10/27 0000)  GBS:   pos  Assessment/Plan: Admit to PACU for routine pre-op Hgb stable at 10+ Dr. 06-12-1981 to consult and consent   Mora Appl Mysha Peeler 11/26/2019, 9:59 AM

## 2019-11-26 NOTE — Transfer of Care (Signed)
Immediate Anesthesia Transfer of Care Note  Patient: Janet Mckee  Procedure(s) Performed: Cesarean section  (Bilateral )  Patient Location: PACU  Anesthesia Type:Spinal  Level of Consciousness: awake, alert  and oriented  Airway & Oxygen Therapy: Patient Spontanous Breathing  Post-op Assessment: Report given to RN and Post -op Vital signs reviewed and stable  Post vital signs: Reviewed and stable  Last Vitals:  Vitals Value Taken Time  BP 108/61 11/26/19 1431  Temp 36.8 C 11/26/19 1431  Pulse 105 11/26/19 1433  Resp 25 11/26/19 1433  SpO2 100 % 11/26/19 1433  Vitals shown include unvalidated device data.  Last Pain:  Vitals:   11/26/19 1431  TempSrc: Oral  PainSc: 0-No pain      Patients Stated Pain Goal: 5 (11/26/19 0858)  Complications: No apparent anesthesia complications

## 2019-11-26 NOTE — Op Note (Signed)
Cesarean Section Procedure Note  Janet Mckee  DOB:    16-Jul-1996  MRN:    [id number]  Date of Surgery:  11/26/2019  Indication: 37 week 4 day SIUP for scheduled primary cesarean delivery due to history of orthopedic injury during her last vaginal delivery now being taken back to operating room for uncontrolled A2 GDM at 37 weeks 4 days per MFM  Pre-operative Diagnosis: 1. Uncontrolled A2 GDM  2. Suspected fetal macrosomia 3. History of orthopedic injury during prior vaginal delivery 4. 37 week 4 day SIUP  Post-operative Diagnosis: same  Procedure:  Primary cesarean delivery                         Surgeon: Wynonia Hazard, MD  Assistants: CRUMPLER, Victorino Dike, CNM  Anesthesia: spinal anesthesia  ASA Class: 2  Procedure Details   The patient was counseled about the risks, benefits, complications of the cesarean section. The patient concurred with the proposed plan, giving informed consent.  The site of surgery properly noted/marked. The patient was taken to Operating Room # B, identified as Janet Mckee and the procedure verified as C-Section Delivery. A Time Out was held and the above information confirmed.  After spinal was found to adequate , the patient was placed in the dorsal supine position with a leftward tilt, draped and prepped in the usual sterile manner. A Pfannenstiel incision was made with a 10 blade scalpel and the incision carried down through the subcutaneous tissue to the fascia.  The fascia was incised in the midline and the fascial incision was extended laterally with Mayo scissors. The superior aspect of the fascial incision was grasped with Coker clamps x2, tented up and the rectus muscles dissected off sharply with the bovie.  The rectus was then dissected off with blunt dissection and the bovie inferiorly. The rectus muscles were separated in the midline. The abdominal peritoneum was identified, and bluntly entered using surgeons fingers. The peritoneal opening  was bluntly extended with gentle pulling.   The Alexis retractor was then deployed. The vesicouterine peritoneum was identified, tented up, entered sharply with Metzenbaum scissors, and the bladder flap was created digitally. Scalpel was then used to make a low transverse incision on the uterus which was extended laterally with  blunt dissection. The fetal vertex was identified and delivered from cephalic presentation.  The head brought out of the abdomen with a vacuum and with fundal pressure given by assistant, a live healthy Female with Apgar scores of 8 at one minute and 9 at five minutes. After the umbilical cord was clamped and cut, the baby was handed off to waiting Pediatricians. cord blood was obtained for evaluation. The placenta was spontaneously removed intact. The placenta was handed off to be sent to L&D.   The uterus was cleared of all clot and debris. The uterine incision was repaired with #0 Monocryl in running locked fashion. A second imbricating suture was performed using the same suture. There was bleeding along the right side of the incision and a figure of eight 0-vicryl stitch was used to make the incision was hemostatic. Ovaries and tubes were inspected and normal. The Alexis retractor was removed. The abdominal cavity was cleared of all clot and debris. The abdominal peritoneum was reapproximated with 2-0 chromic  in a running fashion, the rectus muscles was reapproximated with #2 chromic in interrupted fashion. The fascia was closed with 0 Vicryl in a running fashion. The subcuticular layer was irrigated and all  bleeders cauterized.  30 mL of 0.5% Marcaine was injected into the subcutaneous layer.  The Scarpas fascia was re-approximated with interrupted sutures of 2-0 plain.   The skin was closed with 4-0 vicryl in a subcuticular fashion using a Lanny Hurst needle. The incision was dressed with benzoine, steri strips and pressure dressing. All sponge lap and needle counts were correct x3.    Patient tolerated the procedure well and recovered in stable condition following the procedure.  Instrument, sponge, and needle counts were correct prior the abdominal closure and at the conclusion of the case.   Findings: Live female infant, Apgars 8/9, meconium stained amniotic fluid, placenta normal, normal uterus, bilateral tubes and ovaries  Estimated Blood Loss: 985mL  IVF:  1500 mL LR         Drains: Foley catheter  Urine output: 100 mL clear         Specimens: Placenta to L&D          Implants: none         Complications:  None; patient tolerated the procedure well.         Disposition: PACU - hemodynamically stable.   Janet Mckee Janet Mckee

## 2019-11-26 NOTE — Progress Notes (Signed)
Patients Blood Glucose was 56. Gave ice to assess how she tolerated; tolerated well. Gave apple juice for blood glucose.  Will reassess at 1545 with a repeated blood glucose. Devra Dopp, RN

## 2019-11-26 NOTE — Anesthesia Preprocedure Evaluation (Signed)
Anesthesia Evaluation  Patient identified by MRN, date of birth, ID band Patient awake    Reviewed: Allergy & Precautions, NPO status , Patient's Chart, lab work & pertinent test results  Airway Mallampati: II  TM Distance: >3 FB Neck ROM: Full    Dental no notable dental hx. (+) Teeth Intact   Pulmonary neg pulmonary ROS,    Pulmonary exam normal breath sounds clear to auscultation       Cardiovascular negative cardio ROS Normal cardiovascular exam Rhythm:Regular Rate:Normal     Neuro/Psych PSYCHIATRIC DISORDERS Depression negative neurological ROS     GI/Hepatic Neg liver ROS, GERD  Medicated,  Endo/Other  diabetes, Well Controlled, Gestational, Insulin DependentObesity  Renal/GU negative Renal ROS  negative genitourinary   Musculoskeletal negative musculoskeletal ROS (+)   Abdominal (+) + obese,   Peds  Hematology  (+) anemia ,   Anesthesia Other Findings   Reproductive/Obstetrics (+) Pregnancy Desires C/section Desires permanent sterilization                             Anesthesia Physical Anesthesia Plan  ASA: II  Anesthesia Plan: Spinal   Post-op Pain Management:    Induction:   PONV Risk Score and Plan: 4 or greater and Scopolamine patch - Pre-op, Ondansetron and Treatment may vary due to age or medical condition  Airway Management Planned: Natural Airway  Additional Equipment:   Intra-op Plan:   Post-operative Plan:   Informed Consent: I have reviewed the patients History and Physical, chart, labs and discussed the procedure including the risks, benefits and alternatives for the proposed anesthesia with the patient or authorized representative who has indicated his/her understanding and acceptance.     Dental advisory given  Plan Discussed with: CRNA and Surgeon  Anesthesia Plan Comments:         Anesthesia Quick Evaluation

## 2019-11-27 ENCOUNTER — Inpatient Hospital Stay: Payer: Medicaid Other | Admitting: Hematology

## 2019-11-27 ENCOUNTER — Inpatient Hospital Stay: Payer: Medicaid Other

## 2019-11-27 ENCOUNTER — Encounter (HOSPITAL_COMMUNITY): Payer: Self-pay | Admitting: Obstetrics & Gynecology

## 2019-11-27 LAB — CBC
HCT: 24.1 % — ABNORMAL LOW (ref 36.0–46.0)
Hemoglobin: 7.1 g/dL — ABNORMAL LOW (ref 12.0–15.0)
MCH: 23.2 pg — ABNORMAL LOW (ref 26.0–34.0)
MCHC: 29.5 g/dL — ABNORMAL LOW (ref 30.0–36.0)
MCV: 78.8 fL — ABNORMAL LOW (ref 80.0–100.0)
Platelets: 208 10*3/uL (ref 150–400)
RBC: 3.06 MIL/uL — ABNORMAL LOW (ref 3.87–5.11)
RDW: 29.8 % — ABNORMAL HIGH (ref 11.5–15.5)
WBC: 7.5 10*3/uL (ref 4.0–10.5)
nRBC: 0 % (ref 0.0–0.2)

## 2019-11-27 LAB — RAPID HIV SCREEN (HIV 1/2 AB+AG)
HIV 1/2 Antibodies: NONREACTIVE
HIV-1 P24 Antigen - HIV24: NONREACTIVE

## 2019-11-27 LAB — BIRTH TISSUE RECOVERY COLLECTION (PLACENTA DONATION)

## 2019-11-27 NOTE — Progress Notes (Signed)
Subjective: Postpartum Day 1: Cesarean Delivery Janet Mckee is a 24 y.o. female presenting for primary CS at 37.2 weeks due to poorly controlled GDM and extensive lower extremity surgeries affecting vaginal delivery Patient reports tolerating PO, foley in place.   Reports pain, however RN at bedside with oral agents  Objective: Vital signs in last 24 hours: Temp:  [98 F (36.7 C)-98.9 F (37.2 C)] 98.7 F (37.1 C) (04/06 0050) Pulse Rate:  [80-116] 100 (04/05 2053) Resp:  [17-28] 20 (04/06 0050) BP: (108-135)/(61-98) 118/71 (04/05 2053) SpO2:  [96 %-100 %] 100 % (04/05 2304) Weight:  [90.7 kg] 90.7 kg (04/05 0858)  Physical Exam:  General: alert, cooperative, appears stated age and no distress Lochia: appropriate Uterine Fundus: firm Incision: bandage CDI DVT Evaluation: No evidence of DVT seen on physical exam. Negative Homan's sign. No cords or calf tenderness. No significant calf/ankle edema.  Recent Labs    11/26/19 0912  HGB 10.1*  HCT 35.0*    Assessment/Plan: Status post Cesarean section. Doing well postoperatively.  Continue current care Advanced diet and activity Lactation consult PRN PT desires inpatient circ  Victorino Dike B Laresha Bacorn 11/27/2019, 5:15 AM

## 2019-11-27 NOTE — Progress Notes (Signed)
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 CSW wwent to speak with MOB at bedside. CSW advised MOB and FOB of HIPPA policy in which MOB was agreeable to FOB remaining in the room. CSW advised MOB of CSW's role and the reason for CSW coming to visit with her. 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 MOB reports that  she dealt with symptoms for about 2 months. MOB explained to CSW that she felt isolation, didn't want to engage in anything and felt withdrawn form her oldest child at that time. MOB expressed being given medication but didn't take it. MOB informed CSW that she coped by talking with her mom and other family members. MOB expressed that PPD eventually got better for her.   CSW took time to provide MOB with PPD Checklist as well as SIDS education. CSW educated MOB about PPD. CSW informed MOB of possible supports and interventions to decrease PPD.  CSW also encouraged MOB to seek medical attention if needed for increased signs and symptoms for PPD.   MOB reports that she has support from current FOB, mom, aunt and other family members. MOB expressed that since giving birth she has been on pain but has felt fine otherwise. MOB expressed having all needed items to care for infant with no other concerns. MOB denies SI and HI. CSW has no other concerns.     Janet Mckee S. [name], MSW, LCSW [facility] [place] [phone]  

## 2019-11-28 LAB — TYPE AND SCREEN
ABO/RH(D): O POS
Antibody Screen: NEGATIVE
Unit division: 0
Unit division: 0

## 2019-11-28 LAB — BPAM RBC
Blood Product Expiration Date: 202105032359
Blood Product Expiration Date: 202105042359
Unit Type and Rh: 5100
Unit Type and Rh: 5100

## 2019-11-28 MED ORDER — OXYCODONE-ACETAMINOPHEN 5-325 MG PO TABS: 2.0000 | ORAL_TABLET | ORAL | 0 refills | Status: DC | PRN

## 2019-11-28 MED ORDER — SENNOSIDES-DOCUSATE SODIUM 8.6-50 MG PO TABS: 2.0000 | ORAL_TABLET | Freq: Every evening | ORAL | 1 refills | Status: DC | PRN

## 2019-11-28 MED ORDER — POLYSACCHARIDE IRON COMPLEX 150 MG PO CAPS: 150.0000 mg | ORAL_CAPSULE | Freq: Every day | ORAL | 3 refills | Status: DC

## 2019-11-28 MED ORDER — IBUPROFEN 800 MG PO TABS: 800.0000 mg | ORAL_TABLET | Freq: Four times a day (QID) | ORAL | 0 refills | Status: DC

## 2019-11-28 NOTE — Discharge Summary (Signed)
Primary LTCS OB Discharge Summary     Patient Name: Janet Mckee DOB: 1995-11-11 MRN: [id number]  Date of admission: 11/26/2019 Delivering MD: Essie Hart  Date of delivery: 11/26/2019 Type of delivery: Primary LTCS  Newborn Data: Sex: Baby Boy Name: ? Circumcision: Yes, desires inpatient Live born female  Birth Weight: 8 lb 9.8 oz (3907 g) APGAR: 8, 9  Newborn Delivery   Birth date/time: 11/26/2019 13:30:00 Delivery type: C-Section, Vacuum Assisted Trial of labor: No C-section categorization: Primary     Feeding: bottle Infant being discharge to home with mother in stable condition.   Admitting diagnosis: Encounter for maternal care for low transverse scar from previous cesarean delivery [O34.211] Delivery by elective cesarean section [O82] Status post primary low transverse cesarean section [Z98.891] Intrauterine pregnancy: [redacted]w[redacted]d     Secondary diagnosis:  Active Problems:   Encounter for maternal care for low transverse scar from previous cesarean delivery   Delivery by elective cesarean section   Status post primary low transverse cesarean section                                Complications: None                                                              Intrapartum Procedures: cesarean: low cervical, transverse Postpartum Procedures: none Complications-Operative and Postpartum: none  History of Present Illness: Ms. Janet Mckee is a 24 y.o. female, G3P3003, who presents at [redacted]w[redacted]d weeks gestation. The patient has been followed at  [facility]  Her pregnancy has been complicated by:  Patient Active Problem List   Diagnosis Date Noted  . Encounter for maternal care for low transverse scar from previous cesarean delivery 11/26/2019  . Delivery by elective cesarean section 11/26/2019  . Status post primary low transverse cesarean section 11/26/2019  . Abnormal glucose tolerance test (GTT) during pregnancy, antepartum 10/24/2019    Hospital Course--Scheduled Cesarean: Patient was admitted on 11/26/2019 for a scheduled primary cesarean delivery due to poorly controlled GDM and suspected fetal macrosomia.   She was taken to the operating room, where Dr. Mora Appl performed a primary LTCS under spinal anesthesia, with delivery of a viable female with weight and Apgars as listed below. Infant was in good condition and remained at the patient's bedside.  The patient was taken to recovery in good condition.  Patient planned to bottle feed.  On post-op day 1, patient was doing well, tolerating a regular diet, with Hgb of 7.1. Will prescribe PO iron at discharge.  Throughout her stay, her physical exam was WNL, her incision was CDI, and her vital signs remained stable.  By post-op day 2, she was up ad lib, tolerating a regular diet, with good pain control with po med.  She was deemed to have received the full benefit of her hospital stay, and was discharged home in stable condition.  Contraceptive choice was IUD.    Physical exam  Vitals:   11/27/19 0050 11/27/19 0523 11/27/19 2115 11/28/19 0538  BP:  120/61 126/83 122/73  Pulse:  95 (!) 106 96  Resp: 20 18 19 18   Temp: 98.7 F (37.1 C) 98.2 F (36.8 C) 98.1 F (36.7 C) 98.3 F (36.8  C)  TempSrc: Oral Oral Oral Oral  SpO2:  100% 100% 100%  Weight:      Height:       General: alert, cooperative and no distress Lochia: appropriate Uterine Fundus: firm Incision: Healing well with no significant drainage, honeycomb dressing CDI DVT Evaluation: No evidence of DVT seen on physical exam.  Labs: Lab Results  Component Value Date   WBC 7.5 11/27/2019   HGB 7.1 (L) 11/27/2019   HCT 24.1 (L) 11/27/2019   MCV 78.8 (L) 11/27/2019   PLT 208 11/27/2019   CMP Latest Ref Rng & Units 11/04/2019  Glucose 70 - 99 mg/dL 108(H)  BUN 6 - 20 mg/dL 5(L)  Creatinine 0.44 - 1.00 mg/dL 0.34(L)  Sodium 135 - 145 mmol/L 135  Potassium 3.5 - 5.1 mmol/L 3.8  Chloride 98 - 111 mmol/L 107  CO2 22 -  32 mmol/L 17(L)  Calcium 8.9 - 10.3 mg/dL 8.8(L)  Total Protein 6.5 - 8.1 g/dL 6.0(L)  Total Bilirubin 0.3 - 1.2 mg/dL 0.6  Alkaline Phos 38 - 126 U/L 242(H)  AST 15 - 41 U/L 40  ALT 0 - 44 U/L 32    Date of discharge: 11/28/2019 Discharge Diagnoses: Term Pregnancy-delivered Discharge instruction: per After Visit Summary and "Baby and Me Booklet".  After visit meds:   Activity:           pelvic rest Advance as tolerated. Pelvic rest for 6 weeks.  Diet:                routine Medications: Ibuprofen and Iron Postpartum contraception: IUD Mirena Condition:  Pt discharge to home with baby in stable condition  Meds: Allergies as of 11/28/2019      Reactions   Contrast Media [iodinated Diagnostic Agents]    Sulfa Antibiotics Hives      Medication List    STOP taking these medications   acetaminophen 500 MG tablet Commonly known as: TYLENOL   insulin NPH Human 100 UNIT/ML injection Commonly known as: NOVOLIN N   metoCLOPramide 10 MG tablet Commonly known as: REGLAN   pantoprazole 40 MG tablet Commonly known as: PROTONIX   promethazine 25 MG tablet Commonly known as: PHENERGAN     TAKE these medications   ibuprofen 800 MG tablet Commonly known as: ADVIL Take 1 tablet (800 mg total) by mouth every 6 (six) hours.   iron polysaccharides 150 MG capsule Commonly known as: NIFEREX Take 1 capsule (150 mg total) by mouth daily.   prenatal multivitamin Tabs tablet Take 1 tablet by mouth daily at 12 noon.       Discharge Follow Up:  Follow-up [facility]. Schedule an appointment as soon as possible for a visit in 6 week(s).   Specialty: Obstetrics and Gynecology Contact information: [address] [place] [address] [phone]          11/28/2019, 8:56 AM  Suzan Nailer, CNM

## 2019-12-04 ENCOUNTER — Inpatient Hospital Stay (HOSPITAL_COMMUNITY): Payer: Medicaid Other

## 2019-12-04 ENCOUNTER — Inpatient Hospital Stay (HOSPITAL_COMMUNITY)
Admission: AD | Admit: 2019-12-04 | Discharge: 2019-12-06 | DRG: 776 | Disposition: A | Discharge status: Home / Self Care | Payer: Medicaid Other | Attending: Obstetrics & Gynecology | Admitting: Obstetrics & Gynecology

## 2019-12-04 ENCOUNTER — Other Ambulatory Visit: Payer: Self-pay

## 2019-12-04 ENCOUNTER — Encounter (HOSPITAL_COMMUNITY): Payer: Self-pay | Admitting: Obstetrics & Gynecology

## 2019-12-04 DIAGNOSIS — O26899 Other specified pregnancy related conditions, unspecified trimester: Secondary | ICD-10-CM

## 2019-12-04 DIAGNOSIS — R109 Unspecified abdominal pain: Secondary | ICD-10-CM | POA: Diagnosis present

## 2019-12-04 DIAGNOSIS — O8612 Endometritis following delivery: Principal | ICD-10-CM | POA: Diagnosis present

## 2019-12-04 LAB — URINALYSIS, ROUTINE W REFLEX MICROSCOPIC
Bilirubin Urine: NEGATIVE
Glucose, UA: NEGATIVE mg/dL
Ketones, ur: NEGATIVE mg/dL
Nitrite: NEGATIVE
Protein, ur: 100 mg/dL — AB
RBC / HPF: >50 RBC/hpf — ABNORMAL HIGH (ref 0–5)
Specific Gravity, Urine: 1.026 (ref 1.005–1.030)
WBC, UA: >50 WBC/hpf — ABNORMAL HIGH (ref 0–5)
pH: 6 (ref 5.0–8.0)

## 2019-12-04 LAB — CBC WITH DIFFERENTIAL/PLATELET
Abs Immature Granulocytes: 0.07 10*3/uL (ref 0.00–0.07)
Basophils Absolute: 0 10*3/uL (ref 0.0–0.1)
Basophils Relative: 0 %
Eosinophils Absolute: 0.1 10*3/uL (ref 0.0–0.5)
Eosinophils Relative: 2 %
HCT: 34.9 % — ABNORMAL LOW (ref 36.0–46.0)
Hemoglobin: 10 g/dL — ABNORMAL LOW (ref 12.0–15.0)
Immature Granulocytes: 1 %
Lymphocytes Relative: 16 %
Lymphs Abs: 1.2 10*3/uL (ref 0.7–4.0)
MCH: 23 pg — ABNORMAL LOW (ref 26.0–34.0)
MCHC: 28.7 g/dL — ABNORMAL LOW (ref 30.0–36.0)
MCV: 80.4 fL (ref 80.0–100.0)
Monocytes Absolute: 0.4 10*3/uL (ref 0.1–1.0)
Monocytes Relative: 5 %
Neutro Abs: 6 10*3/uL (ref 1.7–7.7)
Neutrophils Relative %: 76 %
Platelets: 333 10*3/uL (ref 150–400)
RBC: 4.34 MIL/uL (ref 3.87–5.11)
RDW: 29.2 % — ABNORMAL HIGH (ref 11.5–15.5)
WBC: 7.9 10*3/uL (ref 4.0–10.5)
nRBC: 0.4 % — ABNORMAL HIGH (ref 0.0–0.2)

## 2019-12-04 LAB — TYPE AND SCREEN
ABO/RH(D): O POS
Antibody Screen: NEGATIVE

## 2019-12-04 MED ORDER — PRENATAL MULTIVITAMIN CH
1.0000 | ORAL_TABLET | Freq: Every day | ORAL | Status: DC
  Administered 2019-12-05 – 2019-12-06 (×2): 1 via ORAL
  Filled 2019-12-04 (×2): qty 1

## 2019-12-04 MED ORDER — HYDROMORPHONE HCL 1 MG/ML PO LIQD: 1.0000 mg | ORAL | Status: DC | PRN

## 2019-12-04 MED ORDER — KETOROLAC TROMETHAMINE 30 MG/ML IJ SOLN
30.0000 mg | Freq: Three times a day (TID) | INTRAMUSCULAR | Status: DC | PRN
  Administered 2019-12-05: 30 mg via INTRAVENOUS
  Filled 2019-12-04: qty 1

## 2019-12-04 MED ORDER — SENNOSIDES-DOCUSATE SODIUM 8.6-50 MG PO TABS: 2.0000 | ORAL_TABLET | Freq: Every evening | ORAL | Status: DC | PRN

## 2019-12-04 MED ORDER — ACETAMINOPHEN 500 MG PO TABS
1000.0000 mg | ORAL_TABLET | Freq: Four times a day (QID) | ORAL | Status: DC
  Administered 2019-12-04 – 2019-12-06 (×7): 1000 mg via ORAL
  Filled 2019-12-04 (×8): qty 2

## 2019-12-04 MED ORDER — SODIUM CHLORIDE 0.9 % IV SOLN
3.0000 g | Freq: Four times a day (QID) | INTRAVENOUS | Status: DC
  Administered 2019-12-04 – 2019-12-06 (×8): 3 g via INTRAVENOUS
  Filled 2019-12-04 (×7): qty 8
  Filled 2019-12-04: qty 3
  Filled 2019-12-04: qty 8

## 2019-12-04 MED ORDER — POLYSACCHARIDE IRON COMPLEX 150 MG PO CAPS
150.0000 mg | ORAL_CAPSULE | Freq: Every day | ORAL | Status: DC
  Administered 2019-12-05 – 2019-12-06 (×2): 150 mg via ORAL
  Filled 2019-12-04 (×2): qty 1

## 2019-12-04 MED ORDER — ZOLPIDEM TARTRATE 5 MG PO TABS: 5.0000 mg | ORAL_TABLET | Freq: Every evening | ORAL | Status: DC | PRN

## 2019-12-04 MED ORDER — CYCLOBENZAPRINE HCL 10 MG PO TABS
5.0000 mg | ORAL_TABLET | Freq: Three times a day (TID) | ORAL | Status: DC
  Administered 2019-12-04 – 2019-12-06 (×5): 5 mg via ORAL
  Filled 2019-12-04 (×4): qty 1

## 2019-12-04 MED ORDER — KETOROLAC TROMETHAMINE 30 MG/ML IJ SOLN
30.0000 mg | Freq: Once | INTRAMUSCULAR | Status: AC | PRN
  Administered 2019-12-04: 30 mg via INTRAVENOUS
  Filled 2019-12-04: qty 1

## 2019-12-04 MED ORDER — HYDROMORPHONE HCL 1 MG/ML IJ SOLN
1.0000 mg | INTRAMUSCULAR | Status: DC | PRN
  Administered 2019-12-04 – 2019-12-05 (×3): 1 mg via INTRAVENOUS
  Filled 2019-12-04 (×3): qty 1

## 2019-12-04 MED ORDER — HYDROMORPHONE HCL 1 MG/ML IJ SOLN
INTRAMUSCULAR | Status: AC
  Administered 2019-12-04: 1 mg via INTRAVENOUS
  Filled 2019-12-04: qty 1

## 2019-12-04 MED ORDER — ONDANSETRON HCL 4 MG/2ML IJ SOLN
4.0000 mg | Freq: Three times a day (TID) | INTRAMUSCULAR | Status: DC
  Filled 2019-12-04: qty 2

## 2019-12-04 MED ORDER — CALCIUM CARBONATE ANTACID 500 MG PO CHEW: 2.0000 | CHEWABLE_TABLET | ORAL | Status: DC | PRN

## 2019-12-04 MED ORDER — ONDANSETRON HCL 4 MG/2ML IJ SOLN
4.0000 mg | Freq: Three times a day (TID) | INTRAMUSCULAR | Status: DC | PRN
  Administered 2019-12-04: 4 mg via INTRAVENOUS

## 2019-12-04 MED ORDER — HYDROMORPHONE HCL 1 MG/ML IJ SOLN: 1.0000 mg | INTRAMUSCULAR | Status: AC

## 2019-12-04 NOTE — MAU Note (Signed)
About 3 days, started having leaking from incision.  Pain at incision ahs "been just awful'.  The medications aren't working.  Went to office, they felt she might have a uterine infection.  Denies fever. Painful urination, feels like it is putting pressure on incision.

## 2019-12-04 NOTE — H&P (Signed)
Janet Mckee is an 24 y.o. female G3P3003 at 8 days PP, s/p LTCS primary for hx otrho injury with last vaginal delivery.  She presented to office today with c/o abdominal pain. 3 days ago reports incision leaking/crusting from left angle, clear to yellowish. Worsening pelvic pain started at same time radiating to legs, 8/10, not improved with Percocet/Ibuprofen. Ice packs with mild improvement. Associated with dysuria. Felt like a low grade fever yesterday. Reports vaginal odor. Lochia was normal but did pass large clots on 12/01/19 like golf ball Patient is in tears, sobbing. Unable to find relief. Unable to attend to her newborn. Further evaluation in MAU showed normal CBC count with dif and a grossly normal CT of abdomen.  Patient continued with diffuse abdominal pain despite Toradol and Dilaudid IV. She is admitted for observation and presumptive ABX treatment for endometritis.     Past Medical History:  Diagnosis Date  . Depression    PP with 2nd child, ok child  . Gestational diabetes   . Infection    UTI  . Ovarian cyst     Past Surgical History:  Procedure Laterality Date  . CESAREAN SECTION Bilateral 11/26/2019   Procedure: Cesarean section ;  Surgeon: Essie Hart, MD;  Location: [facility]S;  Service: Obstetrics;  Laterality: Bilateral;  . KNEE SURGERY Bilateral    patellar realignment  . TONSILLECTOMY      Family History  Problem Relation Age of Onset  . Anemia Mother     Social History:  reports that she has never smoked. She has never used smokeless tobacco. She reports that she does not drink alcohol or use drugs.  Allergies:  Allergies  Allergen Reactions  . Contrast Media [Iodinated Diagnostic Agents]   . Sulfa Antibiotics Hives    Medications Prior to Admission  Medication Sig Dispense Refill Last Dose  . ibuprofen (ADVIL) 800 MG tablet Take 1 tablet (800 mg total) by mouth every 6 (six) hours. 30 tablet 0 Past Month at Unknown time  .  oxyCODONE-acetaminophen (PERCOCET/ROXICET) 5-325 MG tablet Take 2 tablets by mouth every 4 (four) hours as needed for severe pain ((when tolerating fluids)). 30 tablet 0 12/04/2019 at Unknown time  . Prenatal Vit-Fe Fumarate-FA (PRENATAL MULTIVITAMIN) TABS tablet Take 1 tablet by mouth daily at 12 noon.   12/04/2019 at Unknown time  . senna-docusate (SENOKOT-S) 8.6-50 MG tablet Take 2 tablets by mouth at bedtime as needed for mild constipation. 30 tablet 1 12/04/2019 at Unknown time  . iron polysaccharides (NIFEREX) 150 MG capsule Take 1 capsule (150 mg total) by mouth daily. 30 capsule 3     Review of Systems  Constitutional: Negative.  Negative for appetite change, chills and fever.  HENT: Negative.   Respiratory: Negative for chest tightness and shortness of breath.   Cardiovascular: Negative.   Gastrointestinal: Positive for abdominal pain. Negative for constipation, nausea and vomiting.  Genitourinary: Positive for pelvic pain and vaginal bleeding.  Neurological: Negative for headaches.  Psychiatric/Behavioral: Negative.   All other systems reviewed and are negative.  Physical Exam   unknown if currently breastfeeding.  Physical Exam  Constitutional: She is oriented to person, place, and time. She appears well-developed and well-nourished. No distress.  Cardiovascular: Normal rate and regular rhythm.  Respiratory: Effort normal and breath sounds normal.  GI: Soft. Bowel sounds are normal. She exhibits no distension. There is generalized abdominal tenderness. There is rebound and guarding.  LTCS incision well approximated, steri strips in place and loose bandage over, no  drainage noted, no erythema or induration.  Genitourinary:    Genitourinary Comments: Uterus palp 2 FB bellow U, tender Vaginal lochia small    Musculoskeletal:        General: Normal range of motion.  Neurological: She is alert and oriented to person, place, and time.  Skin: Skin is warm and dry.  Psychiatric:  She has a normal mood and affect.    Blood pressure 117/82, pulse 86, temperature 98.2 F (36.8 C), temperature source Oral, resp. rate 18, SpO2 98 %, not currently breastfeeding.   Results for orders placed or performed during the hospital encounter of 12/04/19 (from the past 24 hour(s))  CBC with Differential/Platelet     Status: Abnormal   Collection Time: 12/04/19  2:13 PM  Result Value Ref Range   WBC 7.9 4.0 - 10.5 K/uL   RBC 4.34 3.87 - 5.11 MIL/uL   Hemoglobin 10.0 (L) 12.0 - 15.0 g/dL   HCT 34.9 (L) 36.0 - 46.0 %   MCV 80.4 80.0 - 100.0 fL   MCH 23.0 (L) 26.0 - 34.0 pg   MCHC 28.7 (L) 30.0 - 36.0 g/dL   RDW 29.2 (H) 11.5 - 15.5 %   Platelets 333 150 - 400 K/uL   nRBC 0.4 (H) 0.0 - 0.2 %   Neutrophils Relative % 76 %   Neutro Abs 6.0 1.7 - 7.7 K/uL   Lymphocytes Relative 16 %   Lymphs Abs 1.2 0.7 - 4.0 K/uL   Monocytes Relative 5 %   Monocytes Absolute 0.4 0.1 - 1.0 K/uL   Eosinophils Relative 2 %   Eosinophils Absolute 0.1 0.0 - 0.5 K/uL   Basophils Relative 0 %   Basophils Absolute 0.0 0.0 - 0.1 K/uL   Immature Granulocytes 1 %   Abs Immature Granulocytes 0.07 0.00 - 0.07 K/uL   Tear Drop Cells PRESENT    Polychromasia PRESENT   Type and screen [place]     Status: None   Collection Time: 12/04/19  3:00 PM  Result Value Ref Range   ABO/RH(D) O POS    Antibody Screen NEG    Sample Expiration      12/07/2019,2359 Performed at [facility], [address] [place], [place] [address]   Urinalysis, Routine w reflex microscopic     Status: Abnormal   Collection Time: 12/04/19  3:14 PM  Result Value Ref Range   Color, Urine YELLOW YELLOW   APPearance CLOUDY (A) CLEAR   Specific Gravity, Urine 1.026 1.005 - 1.030   pH 6.0 5.0 - 8.0   Glucose, UA NEGATIVE NEGATIVE mg/dL   Hgb urine dipstick LARGE (A) NEGATIVE   Bilirubin Urine NEGATIVE NEGATIVE   Ketones, ur NEGATIVE NEGATIVE mg/dL   Protein, ur 100 (A) NEGATIVE mg/dL   Nitrite  NEGATIVE NEGATIVE   Leukocytes,Ua LARGE (A) NEGATIVE   RBC / HPF >50 (H) 0 - 5 RBC/hpf   WBC, UA >50 (H) 0 - 5 WBC/hpf   Bacteria, UA FEW (A) NONE SEEN   Squamous Epithelial / LPF 11-20 0 - 5   Mucus PRESENT     CT ABDOMEN PELVIS WO CONTRAST  Result Date: 12/04/2019 CLINICAL DATA:  Pelvic pain, right upper quadrant abdominal pain, recent cesarean section 11/26/2019 EXAM: CT ABDOMEN AND PELVIS WITHOUT CONTRAST TECHNIQUE: Multidetector CT imaging of the abdomen and pelvis was performed following the standard protocol without IV contrast. COMPARISON:  None. FINDINGS: Lower chest: No acute pleural or parenchymal lung disease. Hepatobiliary: Heterogeneous decreased  attenuation of the liver consistent with hepatic steatosis. Evaluation of the liver parenchyma is limited without IV contrast. Gallbladder is unremarkable. No biliary dilation. Pancreas: Unremarkable. No pancreatic ductal dilatation or surrounding inflammatory changes. Spleen: Normal in size without focal abnormality. Adrenals/Urinary Tract: There are 2 distinct nonobstructing left renal calculi, largest measuring 3 mm lower pole reference image 31. No right-sided calculi. Bladder is unremarkable. The adrenals are normal. Stomach/Bowel: Normal appendix right lower quadrant. No bowel obstruction or ileus. No bowel wall thickening or inflammatory change. Vascular/Lymphatic: No significant vascular findings are present. No enlarged abdominal or pelvic lymph nodes. Reproductive: Uterus is enlarged consistent with recent gravid state. Evaluation of the uterine parenchyma is limited without IV contrast. I do not see any adnexal masses. Other: No free fluid or free gas. Small umbilical fat containing hernia. Postsurgical changes lower anterior abdominal wall from low transverse incision for Caesarean section. No abdominal wall fluid collections or abscess. Minimal residual subcutaneous gas. Musculoskeletal: No acute or destructive bony lesions. Mild  widening of the pubic symphysis is likely related to recent pregnancy and ligamentous laxity. Reconstructed images demonstrate no additional findings. IMPRESSION: 1. Limited evaluation without IV contrast. 2. Punctate nonobstructing left renal calculi. 3. Enlarged uterus consistent with recent gravid state. 4. Heterogeneous decreased attenuation of the liver consistent with hepatic steatosis. 5. Normal appendix. 6. Expected postsurgical changes from recent cesarean section. No evidence of complication. Electronically Signed   By: Sharlet Salina M.D.   On: 12/04/2019 19:25    Assessment/Plan: G3O7564 at 8 wks post op, s/p LTCS for hx orthopedic injury with last delivery Abdominal pain Suspect endometritis CT w/o contrast unremarkable CBC with diff unremarkable  UA hazy and some bacteria, no nitrates, will rpt with sterile sample and UCX  Patient continues to c/o pelvic/abdominal pain, received one dose Dilaudid IV with moderate relief.   Will admit to [facility] for obs, start presumptive treatment for endometritis -Unasyn 3 gm IVPB q 6 hrs - UCX straight cath - rpt CBC w/ diff in AM - pain mgmt - Toradol 30 mg IV q 8 hrs PRN, Dilaudid 1 mg q 3 hrs IV PRN, Flexeril 5 mg PO TID  POC per Dr. Mare Ferrari, CNM 12/04/2019, 10:50 PM

## 2019-12-04 NOTE — MAU Note (Signed)
Called provider w/CT results. Pt will be admitted to [facility].

## 2019-12-04 NOTE — MAU Note (Signed)
Pt drank contrast. CT called for transport.

## 2019-12-04 NOTE — Plan of Care (Signed)
  Problem: Education: Goal: Knowledge of General Education information will improve Description: Including pain rating scale, medication(s)/side effects and non-pharmacologic comfort measures Outcome: Progressing   Problem: Health Behavior/Discharge Planning: Goal: Ability to manage health-related needs will improve Outcome: Progressing   Problem: Clinical Measurements: Goal: Ability to maintain clinical measurements within normal limits will improve Outcome: Progressing Goal: Will remain free from infection Outcome: Progressing Goal: Diagnostic test results will improve Outcome: Progressing Goal: Respiratory complications will improve Outcome: Progressing Goal: Cardiovascular complication will be avoided Outcome: Progressing   Problem: Clinical Measurements: Goal: Will remain free from infection Outcome: Progressing   Problem: Clinical Measurements: Goal: Diagnostic test results will improve Outcome: Progressing   Problem: Clinical Measurements: Goal: Cardiovascular complication will be avoided Outcome: Progressing

## 2019-12-04 NOTE — MAU Provider Note (Addendum)
History     CSN: [id number]  Arrival date and time: 12/04/19 1306   None     Chief complaint: abdominal pain  HPI  S/P  Seen in office today by Dr. Cletis Media for incision check. Reports warmth and worsening pain.  3 days ago reports incision leaking/crusting from left angle, clear to yellowish. Worsening pelvic pain started at same time radiating to legs, 8/10, not improved with Percocet/Ibuprofen. Ice packs with mild improvement. Associated with dysuria. Felt like a low grade fever yesterday. Reports vaginal odor. Lochia was normal but did pass large clots on 12/01/19 like golf ball Patient is in tears, sobbing. Unable to find relief. Unable to attend to her newborn. She received Toradol 30 mg IM in office today. Sent to MAU for further evaluation and w/o of possible endometritis, needs to r/o intraabdominal process.    Patient states Toradol shot helpful but started to wear off. Notes odor from vaginal discharge which she has never had with previous births. No fever or chills.  Not breastfeeding.   OB History    Gravida  3   Para  3   Term  3   Preterm  0   AB  0   Living  3     SAB      TAB      Ectopic      Multiple  0   Live Births  3           Past Medical History:  Diagnosis Date  . Depression    PP with 2nd child, ok child  . Gestational diabetes   . Infection    UTI  . Ovarian cyst     Past Surgical History:  Procedure Laterality Date  . CESAREAN SECTION Bilateral 11/26/2019   Procedure: Cesarean section ;  Surgeon: Sanjuana Kava, MD;  Location: [facility] LD ORS;  Service: Obstetrics;  Laterality: Bilateral;  . KNEE SURGERY Bilateral    patellar realignment  . TONSILLECTOMY      Family History  Problem Relation Age of Onset  . Anemia Mother     Social History   Tobacco Use  . Smoking status: Never Smoker  . Smokeless tobacco: Never Used  Substance Use Topics  . Alcohol use: Never  . Drug use: Never    Allergies:  Allergies  Allergen  Reactions  . Contrast Media [Iodinated Diagnostic Agents]   . Sulfa Antibiotics Hives    Medications Prior to Admission  Medication Sig Dispense Refill Last Dose  . ibuprofen (ADVIL) 800 MG tablet Take 1 tablet (800 mg total) by mouth every 6 (six) hours. 30 tablet 0   . iron polysaccharides (NIFEREX) 150 MG capsule Take 1 capsule (150 mg total) by mouth daily. 30 capsule 3   . oxyCODONE-acetaminophen (PERCOCET/ROXICET) 5-325 MG tablet Take 2 tablets by mouth every 4 (four) hours as needed for severe pain ((when tolerating fluids)). 30 tablet 0   . Prenatal Vit-Fe Fumarate-FA (PRENATAL MULTIVITAMIN) TABS tablet Take 1 tablet by mouth daily at 12 noon.     . senna-docusate (SENOKOT-S) 8.6-50 MG tablet Take 2 tablets by mouth at bedtime as needed for mild constipation. 30 tablet 1     Review of Systems  Constitutional: Negative.  Negative for appetite change, chills and fever.  HENT: Negative.   Respiratory: Negative for chest tightness and shortness of breath.   Cardiovascular: Negative.   Gastrointestinal: Positive for abdominal pain. Negative for constipation, nausea and vomiting.  Genitourinary: Positive for pelvic pain and  vaginal bleeding.  Neurological: Negative for headaches.  Psychiatric/Behavioral: Negative.   All other systems reviewed and are negative.  Physical Exam   unknown if currently breastfeeding.  Physical Exam  Constitutional: She is oriented to person, place, and time. She appears well-developed and well-nourished. No distress.  Cardiovascular: Normal rate and regular rhythm.  Respiratory: Effort normal and breath sounds normal.  GI: Soft. Bowel sounds are normal. She exhibits no distension. There is generalized abdominal tenderness. There is rebound and guarding.  LTCS incision well approximated, steri strips in place and loose bandage over, no drainage noted, no erythema or induration.  Genitourinary:    Genitourinary Comments: Uterus palp 2 FB bellow U,  tender Vaginal lochia small    Musculoskeletal:        General: Normal range of motion.  Neurological: She is alert and oriented to person, place, and time.  Skin: Skin is warm and dry.  Psychiatric: She has a normal mood and affect.    MAU Course  Procedures  MDM Patient is s/p LTCS post op day 8 with increasing abdominal pain and tenderness for past 3 days. Suspect endometritis but cannot r/o intraabdominal process, needs imaging.  CBC w/ diff pending IV saline lock in place Toradol 30 mg IV PRN Abdominal CT w/o contrast pending  Assessment and Plan  G3P3003 at 8 wks post op, s/p LTCS for hx orthopedic injury with last delivery Abdominal pain Suspect endometritis, need to r/o intraabdominal process Contrast allergy Not breastfeeding  CBC w/ diff CT abdomen/pelvis w/o contrast IV Toradol for pain control PRN  Dr. Sallye Ober to follow  Neta Mends, CNM 12/04/2019, 1:22 PM   Addendum:  CT results completed.   Study Result  CLINICAL DATA:  Pelvic pain, right upper quadrant abdominal pain, recent cesarean section 11/26/2019  EXAM: CT ABDOMEN AND PELVIS WITHOUT CONTRAST  TECHNIQUE: Multidetector CT imaging of the abdomen and pelvis was performed following the standard protocol without IV contrast.  COMPARISON:  None.  FINDINGS: Lower chest: No acute pleural or parenchymal lung disease.  Hepatobiliary: Heterogeneous decreased attenuation of the liver consistent with hepatic steatosis. Evaluation of the liver parenchyma is limited without IV contrast. Gallbladder is unremarkable. No biliary dilation.  Pancreas: Unremarkable. No pancreatic ductal dilatation or surrounding inflammatory changes.  Spleen: Normal in size without focal abnormality.  Adrenals/Urinary Tract: There are 2 distinct nonobstructing left renal calculi, largest measuring 3 mm lower pole reference image 31. No right-sided calculi. Bladder is unremarkable. The adrenals  are normal.  Stomach/Bowel: Normal appendix right lower quadrant. No bowel obstruction or ileus. No bowel wall thickening or inflammatory change.  Vascular/Lymphatic: No significant vascular findings are present. No enlarged abdominal or pelvic lymph nodes.  Reproductive: Uterus is enlarged consistent with recent gravid state. Evaluation of the uterine parenchyma is limited without IV contrast.  I do not see any adnexal masses.  Other: No free fluid or free gas. Small umbilical fat containing hernia. Postsurgical changes lower anterior abdominal wall from low transverse incision for Caesarean section. No abdominal wall fluid collections or abscess. Minimal residual subcutaneous gas.  Musculoskeletal: No acute or destructive bony lesions. Mild widening of the pubic symphysis is likely related to recent pregnancy and ligamentous laxity. Reconstructed images demonstrate no additional findings.  IMPRESSION: 1. Limited evaluation without IV contrast. 2. Punctate nonobstructing left renal calculi. 3. Enlarged uterus consistent with recent gravid state. 4. Heterogeneous decreased attenuation of the liver consistent with hepatic steatosis. 5. Normal appendix. 6. Expected postsurgical changes from recent cesarean section.  No evidence of complication.   Electronically Signed   By: Sharlet Salina M.D.   On: 12/04/2019 19:25     CBC with diff unremarkable, UA hazy and some bacteri, no nitrates, will rpt with sterile sample and UCX Patient continues to c/o pelvic/abdominal pain, received one dose Dilaudid IV with relief.   Will admit to [facility] for obs, start presumptive treatment for endometritis -Unasyn 3 gm IVPB q 6 hrs - UCX straight cath - rpt CBC w/ diff in AM - pain mgmt - Toradol 30 mg IV q 8 hrs PRN, Dilaudid 1 mg q 3 hrs IV PRN, Flexeril 5 mg PO TID  POC per Dr. Mare Ferrari, MSN, CNM 12/04/2019, 8:02 PM

## 2019-12-05 ENCOUNTER — Encounter (HOSPITAL_COMMUNITY): Payer: Self-pay | Admitting: Obstetrics & Gynecology

## 2019-12-05 DIAGNOSIS — O8612 Endometritis following delivery: Secondary | ICD-10-CM | POA: Diagnosis not present

## 2019-12-05 DIAGNOSIS — R109 Unspecified abdominal pain: Secondary | ICD-10-CM | POA: Diagnosis not present

## 2019-12-05 LAB — CBC WITH DIFFERENTIAL/PLATELET
Abs Immature Granulocytes: 0.06 10*3/uL (ref 0.00–0.07)
Basophils Absolute: 0 10*3/uL (ref 0.0–0.1)
Basophils Relative: 1 %
Eosinophils Absolute: 0.2 10*3/uL (ref 0.0–0.5)
Eosinophils Relative: 3 %
HCT: 31.2 % — ABNORMAL LOW (ref 36.0–46.0)
Hemoglobin: 9 g/dL — ABNORMAL LOW (ref 12.0–15.0)
Immature Granulocytes: 1 %
Lymphocytes Relative: 28 %
Lymphs Abs: 1.6 10*3/uL (ref 0.7–4.0)
MCH: 23.4 pg — ABNORMAL LOW (ref 26.0–34.0)
MCHC: 28.8 g/dL — ABNORMAL LOW (ref 30.0–36.0)
MCV: 81 fL (ref 80.0–100.0)
Monocytes Absolute: 0.5 10*3/uL (ref 0.1–1.0)
Monocytes Relative: 8 %
Neutro Abs: 3.5 10*3/uL (ref 1.7–7.7)
Neutrophils Relative %: 59 %
Platelets: 289 10*3/uL (ref 150–400)
RBC: 3.85 MIL/uL — ABNORMAL LOW (ref 3.87–5.11)
RDW: 29.1 % — ABNORMAL HIGH (ref 11.5–15.5)
WBC: 5.9 10*3/uL (ref 4.0–10.5)
nRBC: 0 % (ref 0.0–0.2)

## 2019-12-05 MED ORDER — OXYCODONE HCL 5 MG PO TABS
5.0000 mg | ORAL_TABLET | Freq: Four times a day (QID) | ORAL | Status: DC | PRN
  Administered 2019-12-05 – 2019-12-06 (×3): 10 mg via ORAL
  Filled 2019-12-05 (×3): qty 2

## 2019-12-05 MED ORDER — SODIUM CHLORIDE 0.9 % IV SOLN
INTRAVENOUS | Status: DC | PRN
  Administered 2019-12-05 – 2019-12-06 (×4): 500 mL via INTRAVENOUS

## 2019-12-05 MED ORDER — IBUPROFEN 600 MG PO TABS
600.0000 mg | ORAL_TABLET | Freq: Four times a day (QID) | ORAL | Status: DC
  Administered 2019-12-05 – 2019-12-06 (×4): 600 mg via ORAL
  Filled 2019-12-05 (×4): qty 1

## 2019-12-05 NOTE — Plan of Care (Signed)
  Problem: Safety: Goal: Ability to remain free from injury will improve Outcome: Progressing  Patient aware of progression with infection and use of antibiotic therapy. Will move towards d/c of IV pain meds today.

## 2019-12-05 NOTE — Progress Notes (Addendum)
Janet Mckee [id number] Postpartum Readmission for presumed endometritis  Windell Moulding, [id number], 9 days S/P Primary  LT Cesarean Section due to uncontrolled A2GDM. She presented to office yesterday with c/o abdominal pain. 3 days ago reports incision leaking/crusting from left angle, clear to yellowish. Worsening pelvic pain started at same time radiating to legs, 8/10, not improved with Percocet/Ibuprofen. Ice packs with mild improvement. Associated with dysuria. Felt like a low grade fever yesterday. Reports foul vaginal odor. Evaluation in MAU yesterday showed normal CBC count with differential and a grossly normal CT of abdomen. Patient continued with diffuse abdominal pain despite Toradol and Dilaudid IV. She was admitted for observation and presumptive ABX treatment for endometritis.   Subjective: Patient resting in bed with family member at the bedside. States pain has improved overnight but still tender. States she has difficulty getting out of bed to use restroom due to severe pain. States her lochia still has a foul odor but not as bad as yesterday. States her baby is coming to stay with her today.   Objective: Patient Vitals for the past 24 hrs:  BP Temp Temp src Pulse Resp SpO2  12/05/19 1119 121/65 98.1 F (36.7 C) Oral 78 18 99 %  12/05/19 0800 136/81 98 F (36.7 C) Oral 82 18 100 %  12/05/19 0524 113/62 98 F (36.7 C) Oral 65 18 99 %  12/05/19 0235 122/71 98 F (36.7 C) Oral 73 18 --  12/04/19 2258 114/78 98 F (36.7 C) Oral 67 18 100 %  12/04/19 2055 117/82 98.2 F (36.8 C) Oral 86 -- 98 %  12/04/19 1717 131/76 -- -- 78 -- --    Physical Exam:  General: alert and cooperative Mood/Affect: appropriate Lungs: clear to auscultation, no wheezes, rales or rhonchi, symmetric air entry.  Heart: normal rate, regular rhythm, normal S1, S2, no murmurs, rubs, clicks or gallops. Breast: breasts appear normal, no suspicious masses, no skin or nipple changes or axillary  nodes. Abdomen:  + bowel sounds, soft, non-tender Incision: healing well, open to air Uterine Fundus: firm Lochia: appropriate Skin: Warm, Dry. DVT Evaluation: No evidence of DVT seen on physical exam.  Labs: Recent Labs    12/04/19 1413 12/05/19 0552  HGB 10.0* 9.0*  HCT 34.9* 31.2*  WBC 7.9 5.9    CBG (last 3)  No results for input(s): GLUCAP in the last 72 hours.   I/O: I/O last 3 completed shifts: In: 480 [P.O.:480] Out: 1050 [Urine:1050]   Assessment Windell Moulding, [id number], at 8 days S/P Primary LT Cesarean Section due to uncontrolled A2GDM.   Presumed endometritis CBC unremarkable Vital signs stable Abdominal pain  Plan: Continue Unasyn 3gm IVPB q 6 hours Pt is eating and drinking. Will switch to PO pain medication. Continue PO iron  Urine culture pending. Will consider discharge tomorrow with PO antiobiotics  Dr. Su Hilt to be updated on patient status  Clancy Gourd, MSN 12/05/2019, 3:06 PM  Pain 8/10 today.  10/10 yesterday on admission.  Started on abx about 8p yesterday.  Pt will need vaginal exam prior to discharge.  Still too tender.  If tenderness persists for greater than 24-48hrs, speculum exam will need to be done after giving pain meds.  Continues to be afebrile.

## 2019-12-05 NOTE — Progress Notes (Signed)
Call to Dione Plover, CNM re: wound care and moving pt. Off of IV meds. Per Dione Plover, CNM wound can be open to air as infection is presumed to be endometritis. Will work on d/c of IV pain meds and taking orals as patient is eating and drinking.

## 2019-12-06 LAB — URINE CULTURE: Culture: NO GROWTH

## 2019-12-06 MED ORDER — AMOXICILLIN-POT CLAVULANATE 875-125 MG PO TABS: 1.0000 | ORAL_TABLET | Freq: Two times a day (BID) | ORAL | 0 refills | Status: AC

## 2019-12-06 MED ORDER — CYCLOBENZAPRINE HCL 10 MG PO TABS: 10.0000 mg | ORAL_TABLET | Freq: Three times a day (TID) | ORAL | 0 refills | Status: AC | PRN

## 2019-12-06 NOTE — Discharge Summary (Addendum)
Postpartum readmit Discharge Summary     Patient Name: Janet Mckee DOB: 1996-04-12 MRN: [id number]  Date of admission: 11/26/2019 Delivering MD: Essie Hart  Date of delivery: 11/26/2019 Type of delivery: Primary LTCS.   Newborn Data: Sex: Baby Boy Circumcision:completed.  Live born female  Birth Weight: 8 lb 9.8 oz (3907 g) APGAR: 8, 9  Newborn Delivery   Birth date/time: 11/26/2019 13:30:00 Delivery type: C-Section, Vacuum Assisted Trial of labor: No C-section categorization: Primary     Feeding: bottle Infant being discharge to home with mother in stable condition.   Admitting diagnosis: Abdominal pain , suspected endometritis.   Intrauterine pregnancy: POD# 9 from [facility] @ [redacted]w[redacted]d for poorly controlled diabetes and history of orthopaedic injury during a vaginal delivery.      Complications: None                                                              Intrapartum Procedures: cesarean: low cervical, transverse Postpartum Procedures: none Complications-Operative and Postpartum: Currently abdominal pain.   History of Present Illness: Ms. Janet Mckee is a 24 y.o. female, [phone], who presents at [redacted]w[redacted]d weeks gestation. The patient has been followed at  [facility]  Her pregnancy has been complicated by:      Patient Active Problem List   Diagnosis Date Noted  . Encounter for maternal care for low transverse scar from previous cesarean delivery 11/26/2019  . Delivery by elective cesarean section 11/26/2019  . Status post primary low transverse cesarean section 11/26/2019  . Abnormal glucose tolerance test (GTT) during pregnancy, antepartum 10/24/2019   Hospital Course--Scheduled Cesarean: Patient was admitted on 4/13 which was a PP readmit for abdominal pain, pt on 11/26/2019 for a scheduled primary cesarean delivery due to poorly controlled GDM and suspected fetal macrosomia. She was taken to the operating room, where Dr. Mora Appl  performed a primary LTCS under spinal anesthesia, with delivery of a viable female with weight and Apgars as listed below. Infant was in good condition and remained at the patient's bedside.  The patient was taken to recovery in good condition.  Patient planned to bottle feed.  On post-op day 1, patient was doing well, tolerating a regular diet, with Hgb of 7.1. Will prescribe PO iron at discharge.  Throughout her stay, her physical exam was WNL, her incision was CDI, and her vital signs remained stable.  By post-op day 2, she was up ad lib, tolerating a regular diet, with good pain control with po med.  She was deemed to have received the full benefit of her hospital stay, and was discharged home in stable condition.  Contraceptive choice was IUD. During her stay this time pt was tx with Unasyn IV, has always been afebrile, BC drawn, vaginal culture sent today, normal bleeding, abdomen, appears tender to touch due to nerve pain from PCS, abdomen is soft. CT scan was unremarkable. Pt to be discharge today in stable condition on augmentin BID for 14 days per Dr Sallye Ober. Pt to f/u in 2 weeks.   Physical exam  Vitals:   12/05/19 1924 12/06/19 0414 12/06/19 0810 12/06/19 1147  BP:  (!) 114/58 130/77 122/60  Pulse: 81 82 74 72  Resp: 18 18 19 19   Temp: 98.2 F (36.8 C) 98  F (36.7 C) 98 F (36.7 C) 98.1 F (36.7 C)  TempSrc: Oral Oral Oral Oral  SpO2: 100% 100%     General: alert, cooperative and no distress Lochia: appropriate Abdomen: tender to the touch with skin, soft.  Uterine Fundus: firm, -3 Incision: Healing well with no significant drainage, No significant erythema, Dressing is clean, dry, and intact, CDI Perineum: Intact with light lochia.  DVT Evaluation: No evidence of DVT seen on physical exam. Negative Homan's sign. No cords or calf tenderness. No significant calf/ankle edema.  Labs: Lab Results  Component Value Date   WBC 5.9 12/05/2019   HGB 9.0 (L) 12/05/2019   HCT 31.2 (L)  12/05/2019   MCV 81.0 12/05/2019   PLT 289 12/05/2019   CMP Latest Ref Rng & Units 11/04/2019  Glucose 70 - 99 mg/dL 108(H)  BUN 6 - 20 mg/dL 5(L)  Creatinine 0.44 - 1.00 mg/dL 0.34(L)  Sodium 135 - 145 mmol/L 135  Potassium 3.5 - 5.1 mmol/L 3.8  Chloride 98 - 111 mmol/L 107  CO2 22 - 32 mmol/L 17(L)  Calcium 8.9 - 10.3 mg/dL 8.8(L)  Total Protein 6.5 - 8.1 g/dL 6.0(L)  Total Bilirubin 0.3 - 1.2 mg/dL 0.6  Alkaline Phos 38 - 126 U/L 242(H)  AST 15 - 41 U/L 40  ALT 0 - 44 U/L 32    Date of discharge: 12/06/2019 Discharge Diagnoses:   Postpartum endometritis.  Discharge instruction: per After Visit Summary and "Baby and Me Booklet".  After visit meds:   Activity:           unrestricted and pelvic rest Advance as tolerated. Pelvic rest for 6 weeks.  Diet:                routine Medications: PNV, Ibuprofen and tylenol and augmentin BID Postpartum contraception: IUD Mirena Condition:  Pt discharge to home with baby in stable Possible endometriosis: report fever >100.4, severe abdominal pain, not improving with motrin and tylenol, f/u in 2 weeks at [facility].   Meds: Allergies as of 12/06/2019      Reactions   Contrast Media [iodinated Diagnostic Agents] Hives   Sulfa Antibiotics Hives      Medication List    STOP taking these medications   oxyCODONE-acetaminophen 5-325 MG tablet Commonly known as: PERCOCET/ROXICET     TAKE these medications   amoxicillin-clavulanate 875-125 MG tablet Commonly known as: Augmentin Take 1 tablet by mouth every 12 (twelve) hours for 14 days.   cyclobenzaprine 10 MG tablet Commonly known as: FLEXERIL Take 1 tablet (10 mg total) by mouth 3 (three) times daily as needed for up to 3 days for muscle spasms.   ibuprofen 800 MG tablet Commonly known as: ADVIL Take 1 tablet (800 mg total) by mouth every 6 (six) hours.   iron polysaccharides 150 MG capsule Commonly known as: NIFEREX Take 1 capsule (150 mg total) by mouth daily.   prenatal  multivitamin Tabs tablet Take 1 tablet by mouth daily at 12 noon.   senna-docusate 8.6-50 MG tablet Commonly known as: Senokot-S Take 2 tablets by mouth at bedtime as needed for mild constipation.       Discharge Follow Up:  Follow-up [facility]. Schedule an appointment as soon as possible for a visit in 2 week(s).   Specialty: Obstetrics and Gynecology Contact information: [address] [place] [address] [place], NP-C, CNM  12/06/2019, 1:45 PM  Dale [place], FNP

## 2019-12-06 NOTE — Plan of Care (Signed)
  Problem: Clinical Measurements: Goal: Ability to maintain clinical measurements within normal limits will improve Outcome: Progressing   Problem: Clinical Measurements: Goal: Will remain free from infection Outcome: Progressing  Pt. Aware of POC to move towards PO ABX trmt. States she is not in as much pain as yesterday.

## 2019-12-06 NOTE — Plan of Care (Signed)
?  Problem: Clinical Measurements: ?Goal: Ability to maintain clinical measurements within normal limits will improve ?Outcome: Progressing ?Goal: Will remain free from infection ?Outcome: Progressing ?Goal: Diagnostic test results will improve ?Outcome: Progressing ?  ?Problem: Clinical Measurements: ?Goal: Will remain free from infection ?Outcome: Progressing ?  ?Problem: Clinical Measurements: ?Goal: Diagnostic test results will improve ?Outcome: Progressing ?  ?

## 2019-12-11 LAB — AEROBIC/ANAEROBIC CULTURE W GRAM STAIN (SURGICAL/DEEP WOUND)
Culture: NORMAL
Gram Stain: NONE SEEN

## 2020-02-07 ENCOUNTER — Ambulatory Visit: Payer: Medicaid Other | Attending: Obstetrics and Gynecology | Admitting: Physical Therapy

## 2020-02-07 ENCOUNTER — Other Ambulatory Visit: Payer: Self-pay

## 2020-02-07 ENCOUNTER — Encounter: Payer: Self-pay | Admitting: Physical Therapy

## 2020-02-07 DIAGNOSIS — R279 Unspecified lack of coordination: Secondary | ICD-10-CM | POA: Diagnosis present

## 2020-02-07 DIAGNOSIS — M6281 Muscle weakness (generalized): Secondary | ICD-10-CM | POA: Diagnosis not present

## 2020-02-07 DIAGNOSIS — R102 Pelvic and perineal pain: Secondary | ICD-10-CM | POA: Diagnosis present

## 2020-02-07 NOTE — Therapy (Signed)
[facility] [address] [place], [place], [address] Phone: [phone]   Fax:  [phone]  Physical Therapy Evaluation  Patient Details  Name: Janet Mckee MRN: [id number] Date of Birth: Jan 30, 1996 Referring Provider (PT): Dr. Riccardo Dubin, CNM   Encounter Date: 02/07/2020   PT End of Session - 02/07/20 1545    Visit Number 1    Date for PT Re-Evaluation 05/01/20    Authorization Type Medicaid    PT Start Time 1445    PT Stop Time 1530    PT Time Calculation (min) 45 min    Activity Tolerance Patient tolerated treatment well;No increased pain    Behavior During Therapy WFL for tasks assessed/performed           Past Medical History:  Diagnosis Date   Depression    PP with 2nd child, ok child   Gestational diabetes    Infection    UTI   Ovarian cyst     Past Surgical History:  Procedure Laterality Date   CESAREAN SECTION Bilateral 11/26/2019   Procedure: Cesarean section ;  Surgeon: Essie Hart, MD;  Location: [facility] LD ORS;  Service: Obstetrics;  Laterality: Bilateral;   KNEE SURGERY Bilateral    patellar realignment   TONSILLECTOMY      There were no vitals filed for this visit.    Subjective Assessment - 02/07/20 1453    Subjective Patient has trouble bending to grab stuff.    Currently in Pain? Yes    Pain Score 6    high 9/10   Pain Location Abdomen    Pain Orientation Lower    Pain Descriptors / Indicators Throbbing    Pain Type Acute pain    Pain Onset More than a month ago    Pain Frequency Constant    Aggravating Factors  bending down, going up and down stairs, getting in bed    Pain Relieving Factors has to lay down, using a step stool to get in bed, hold where her incision is    Multiple Pain Sites No              [facility] PT Assessment - 02/07/20 0001      Assessment   Medical Diagnosis R10.2 Pelvic and preineal pain    Referring Provider (PT) Dr. Riccardo Dubin, CNM    Onset Date/Surgical Date 11/26/19    Prior Therapy None      Precautions   Precautions None      Restrictions   Weight Bearing Restrictions No      Balance Screen   Has the patient fallen in the past 6 months No    Has the patient had a decrease in activity level because of a fear of falling?  No    Is the patient reluctant to leave their home because of a fear of falling?  No      Home [employer]ist information centre manager residence      Prior Function   Level of Independence Independent      Cognition   Overall Cognitive Status Within Functional Limits for tasks assessed      Sensation   Additional Comments decreased sensation on the right side of the incision      Posture/Postural Control   Posture/Postural Control Postural limitations    Posture Comments enlarged abdomen      ROM / Strength   AROM / PROM / Strength AROM;PROM;Strength      AROM  Lumbar Flexion decreased by 75%    Lumbar Extension decreased by 75%    Lumbar - Right Side Bend decreased by 25%    Lumbar - Left Side Bend decreased by 25%    Lumbar - Right Rotation decreased by 75%    Lumbar - Left Rotation decreased by 25%      PROM   Right Hip External Rotation  35    Left Hip External Rotation  40      Strength   Right Hip Flexion 3+/5    Right Hip External Rotation  4/5    Right Hip Internal Rotation 4/5    Left Hip Flexion 3+/5    Left Hip External Rotation 4/5    Left Hip Internal Rotation 4/5      Palpation   Spinal mobility decreased movement of T5-L5    SI assessment  right ilium anteriorly rotated    Palpation comment decreased lower rib cage expansion; tenderness along the c-section scar and throughout the abdomen, tenderness located suprapubically                      Objective measurements completed on examination: See above findings.     Pelvic Floor Special Questions - 02/07/20 0001    Prior Pregnancies Yes    Number of Pregnancies 3    20016,2019, 2021   Number of C-Sections 1    Number of Vaginal Deliveries 2    Diastasis Recti 3 fingers width above and below umbilicus    Currently Sexually Active No    Marinoff Scale pain prevents any attempts at intercourse    Urinary Leakage No    Fecal incontinence No    Falling out feeling (prolapse) No    External Perineal Exam tenderness located on the levator ani, ischicavernosus, and bulbocavernosus    Exam Type Deferred   due to the pain           [facility] Adult PT Treatment/Exercise - 02/07/20 0001      Self-Care   Self-Care Other Self-Care Comments    Other Self-Care Comments  how to desensitize the c-section scar with using a wash cloth       Therapeutic Activites    Therapeutic Activities Other Therapeutic Activities    Other Therapeutic Activities rolling in bed with abdominal bracing, getting in and out of bed with abdominal bracing, sit to stand with increasead hip hinging      Lumbar Exercises: Supine   Ab Set 10 reps    AB Set Limitations contracting the abdomen with breathing out and letting the abdomen sink in to contract                  PT Education - 02/07/20 1544    Education Details abdominal bracing, bed mobility, sit to stand, desensitize the c-section scar    Person(s) Educated Patient    Methods Explanation;Demonstration    Comprehension Returned demonstration;Verbalized understanding            PT Short Term Goals - 02/07/20 1556      PT SHORT TERM GOAL #1   Title independent with initial HEP    Baseline not educated yet    Time 4    Period Weeks    Status New    Target Date 03/06/20      PT SHORT TERM GOAL #2   Title understand scar massage to reduce her pain    Baseline not educated yet    Time  4    Period Weeks    Status New    Target Date 03/06/20             PT Long Term Goals - 02/07/20 1557      PT LONG TERM GOAL #1   Title independent with advanced HEP    Baseline not educated yet    Time 12    Period  Weeks    Status New    Target Date 05/01/20      PT LONG TERM GOAL #2   Title able to contract her abdominals with reduction of diastasis recti </= 1.5 fingers width while performing daily tasks    Baseline diastasis recti 3 finger width, no abdominal tone    Time 12    Period Weeks    Status New    Target Date 05/01/20      PT LONG TERM GOAL #3   Title able to pick up her infant with correct body mechanics with pain </= 2/10 due to increased core stability    Baseline pain level 8/10 and not able to engage her abdominals    Time 8    Period Weeks    Status New    Target Date 05/01/20      PT LONG TERM GOAL #4   Title able to bend down to tie her shoes or pick up an item due to pain decreased </= 2/10    Time 12    Period Weeks    Status New    Target Date 05/01/20      PT LONG TERM GOAL #5   Title pelvic pain decreased >/= 2/10 so she is able to have a vaginal exam    Baseline pain level 10/10 and not able to have a vaginal exam    Time 12    Period Weeks    Status New    Target Date 05/01/20                  Plan - 02/07/20 1546    Clinical Impression Statement Patient is a 24 year old with pelvic pain since she gave birth on 11/26/2019. Patient was in the hospital for 3 days after the birht of her child and 2 days when she had an infection of the uterus. Patient had a c-section scar that is painful and restricted. Patient reports her pain is level 8/10 with movement and touching her abdomen. Lumbar ROM is limited by 75%. Bilateral hip external rotation is limited. Abdominal strength is 1/5 with 3 finger width diastasis recti above and below the umbilicus. Decreased excursion of the lower rib cage with breath. Right ilium is rotated anteriorly. Bilateral hip strength 3+/5. Patient reports her pain prevents her from picking up her children, laying on her abdomen, and not able to tolerate an internal exam. Patient will benefit from skilled therapy to improve core strength  to perform task and decreased pain with improving c-section scar mobility.    Personal Factors and Comorbidities Comorbidity 1;Comorbidity 2;Sex;Fitness    Comorbidities c-section scar; s/p infection of urterus    Examination-Activity Limitations Bed Mobility;Bathing;Locomotion Level;Transfers;Bend;Caring for Others;Carry;Dressing;Stand;Stairs;Sleep    Examination-Participation Restrictions Meal Prep;Cleaning;Community Activity;Driving;Interpersonal Relationship;Laundry    Stability/Clinical Decision Making Evolving/Moderate complexity    Clinical Decision Making Moderate    Rehab Potential Excellent    PT Frequency 1x / week    PT Duration 12 weeks    PT Treatment/Interventions ADLs/Self Care Home Management;Cryotherapy;Electrical Stimulation;Iontophoresis 4mg /ml Dexamethasone;Moist Heat;Ultrasound;Neuromuscular re-education;Therapeutic exercise;Therapeutic activities;Stair  training;Gait training;Patient/family education;Manual techniques;Dry needling;Passive range of motion;Scar mobilization;Taping;Spinal Manipulations;Joint Manipulations    PT Next Visit Plan work on c-section scar; give stretches for hips and back, working on rib excursion, abdominal bracing, spinal mobilization, bulging the pelvic floor    Consulted and Agree with Plan of Care Patient           Patient will benefit from skilled therapeutic intervention in order to improve the following deficits and impairments:  Decreased coordination, Decreased range of motion, Difficulty walking, Increased fascial restricitons, Decreased endurance, Pain, Increased muscle spasms, Decreased activity tolerance, Decreased scar mobility, Decreased strength, Decreased mobility  Visit Diagnosis: Muscle weakness (generalized) - Plan: PT plan of care cert/re-cert  Unspecified lack of coordination - Plan: PT plan of care cert/re-cert  Pelvic pain - Plan: PT plan of care cert/re-cert     Problem List Patient Active Problem List    Diagnosis Date Noted   Abdominal pain postpartum 12/04/2019   Abdominal pain during pregnancy, unspecified trimester 12/04/2019   Encounter for maternal care for low transverse scar from previous cesarean delivery 11/26/2019   Delivery by elective cesarean section 11/26/2019   Status post primary low transverse cesarean section 11/26/2019   Abnormal glucose tolerance test (GTT) during pregnancy, antepartum 10/24/2019    Eulis Foster, PT 02/07/20 4:04 PM   [facility] [address] [place], [place], [address] Phone: [phone]   Fax:  [phone]  Name: Janet Mckee MRN: [id number] Date of Birth: 07-14-1996

## 2020-03-13 ENCOUNTER — Ambulatory Visit: Payer: Medicaid Other | Attending: Obstetrics and Gynecology | Admitting: Physical Therapy

## 2020-03-20 ENCOUNTER — Ambulatory Visit: Payer: Medicaid Other | Admitting: Physical Therapy

## 2020-03-27 ENCOUNTER — Ambulatory Visit: Payer: Medicaid Other | Attending: Obstetrics and Gynecology | Admitting: Physical Therapy

## 2021-06-30 IMAGING — CT CT ABD-PELV W/O CM
2 of 4 series · 16 of 46 positions shown, 18 images · non-contrast
Comparison: None.

CLINICAL DATA: Pelvic pain, right upper quadrant abdominal pain,
recent cesarean section 11/26/2019

EXAM:
CT ABDOMEN AND PELVIS WITHOUT CONTRAST
TECHNIQUE: Multidetector CT imaging of the abdomen and pelvis was performed
following the standard protocol without IV contrast.

[Series 3: a/p w/o 5mm · axial · non-contrast · 0.98mm/px · z∈[+930,+1365]mm · 13 of 95 slices shown, 15 images]
[im 4/95  soft-tissue]
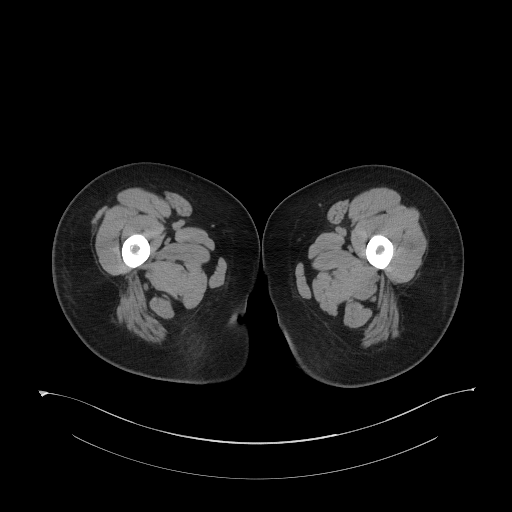
[im 4/95  bone]
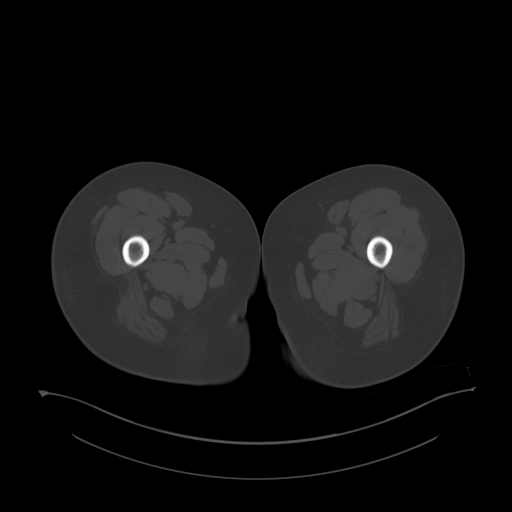
[im 12/95  soft-tissue]
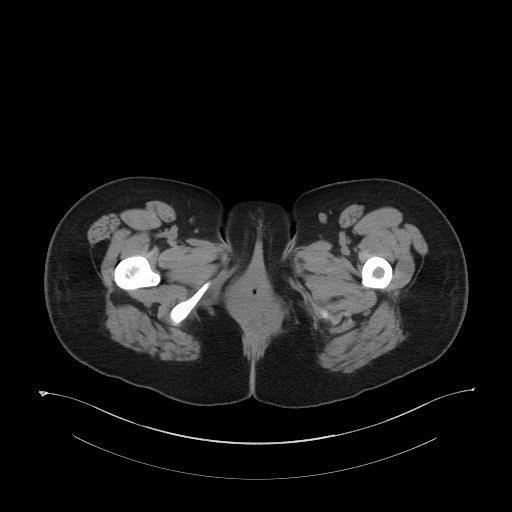
[im 19/95  soft-tissue]
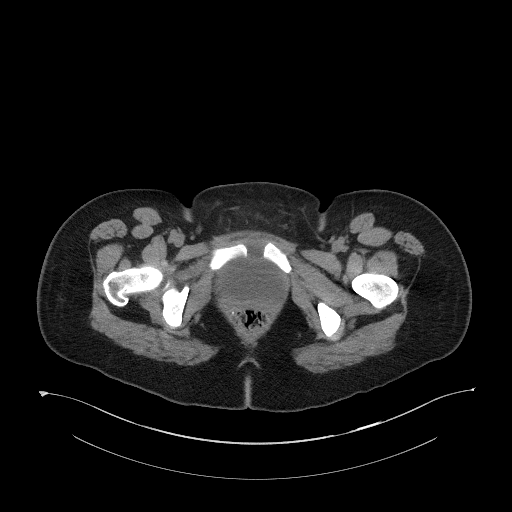
[im 27/95  soft-tissue]
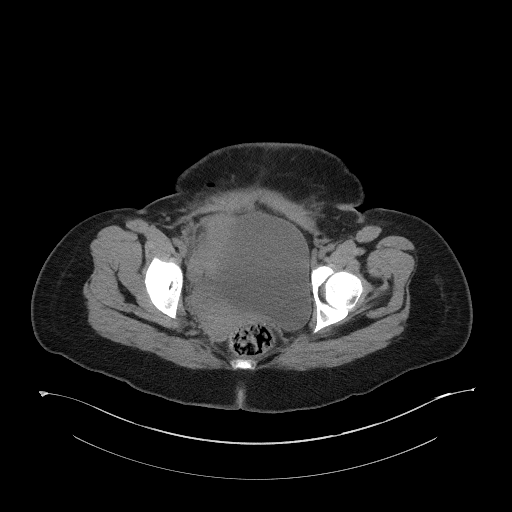
[im 34/95  soft-tissue]
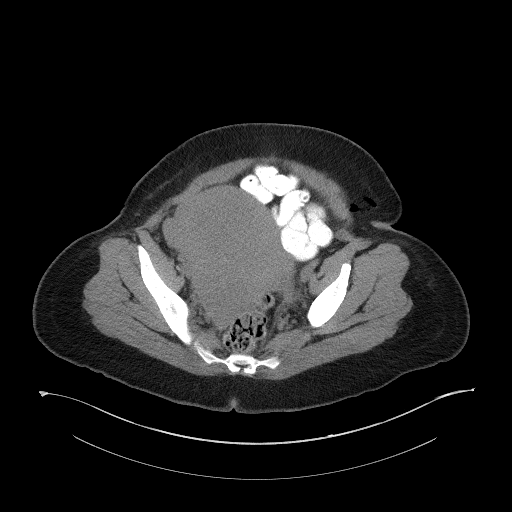
[im 42/95  soft-tissue]
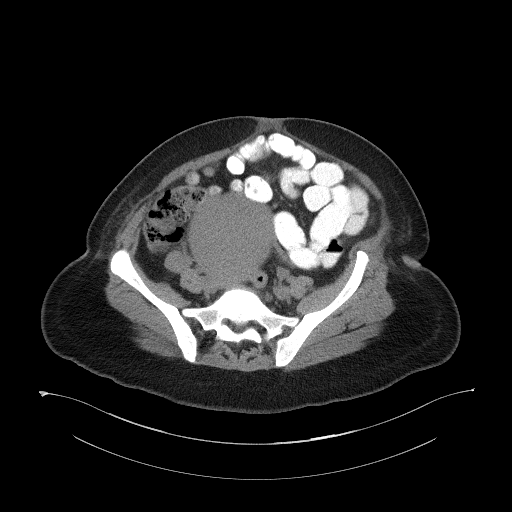
[im 49/95  soft-tissue]
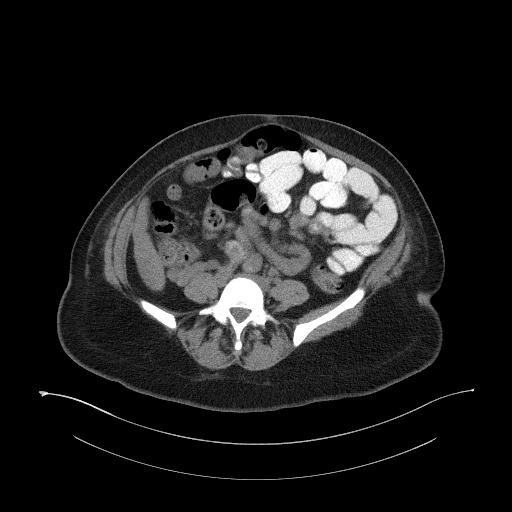
[im 53/95  soft-tissue]
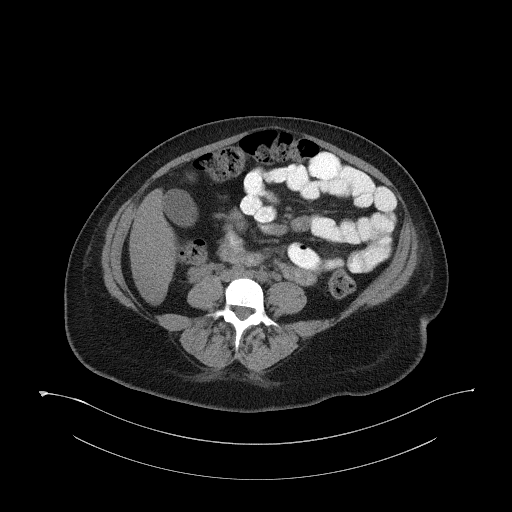
[im 61/95  soft-tissue]
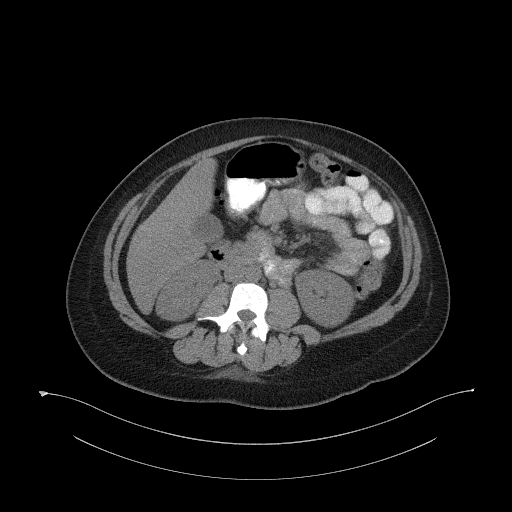
[im 61/95  bone]
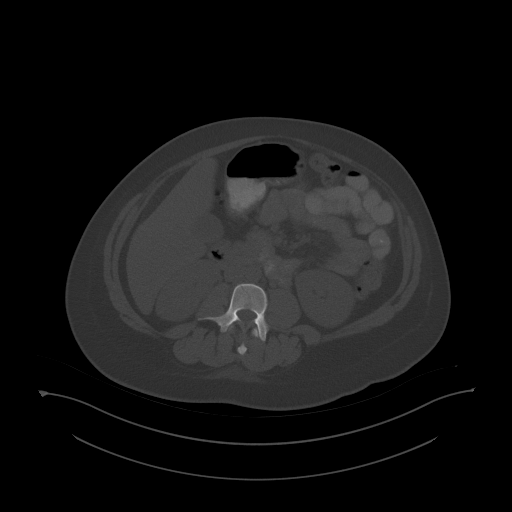
[im 68/95  soft-tissue]
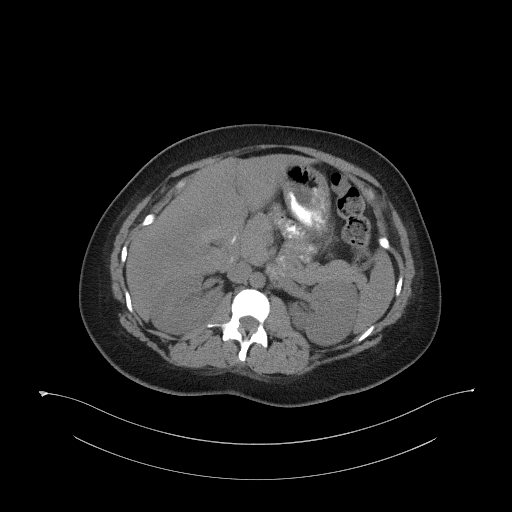
[im 76/95  soft-tissue]
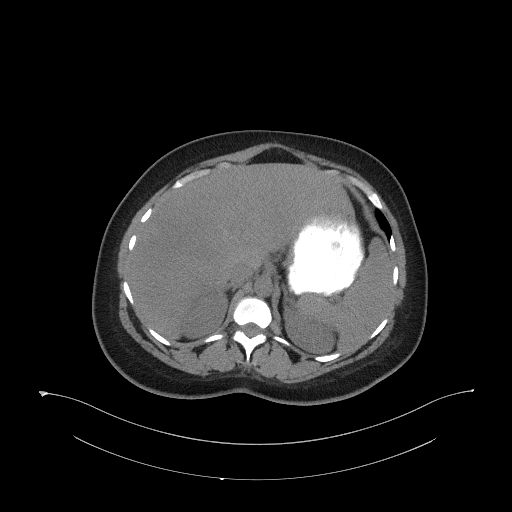
[im 83/95  soft-tissue]
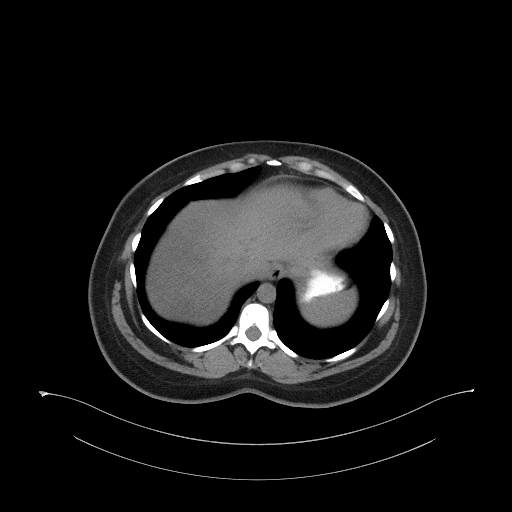
[im 91/95  soft-tissue]
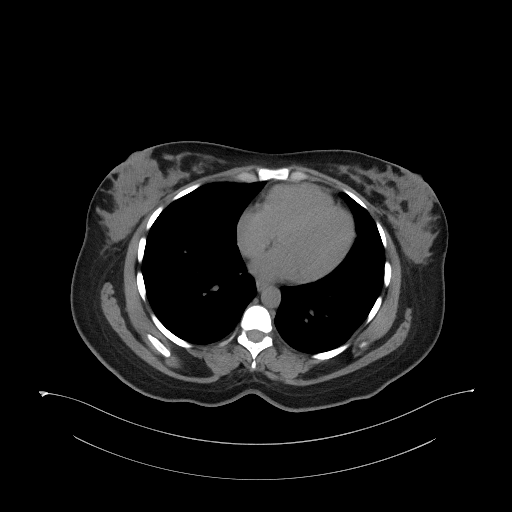

[Series 6: a/p w/o cor · coronal · non-contrast · 0.93mm/px · 3 of 176 slices shown]
[im 59/176  soft-tissue]
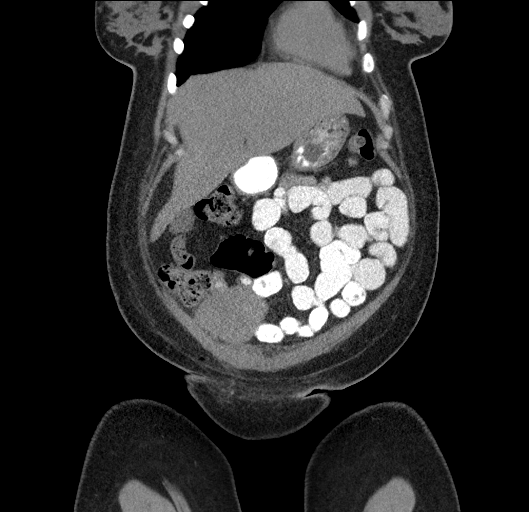
[im 78/176  soft-tissue]
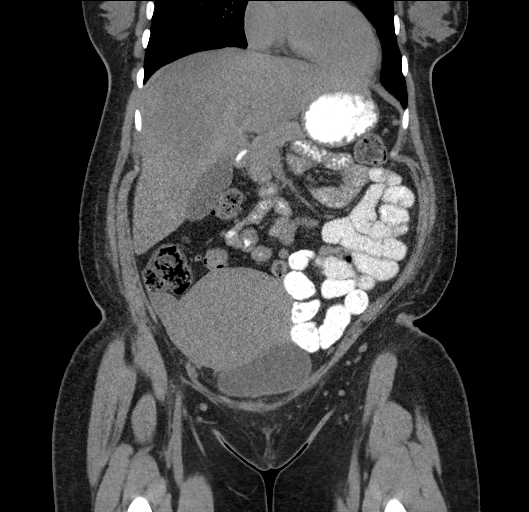
[im 98/176  soft-tissue]
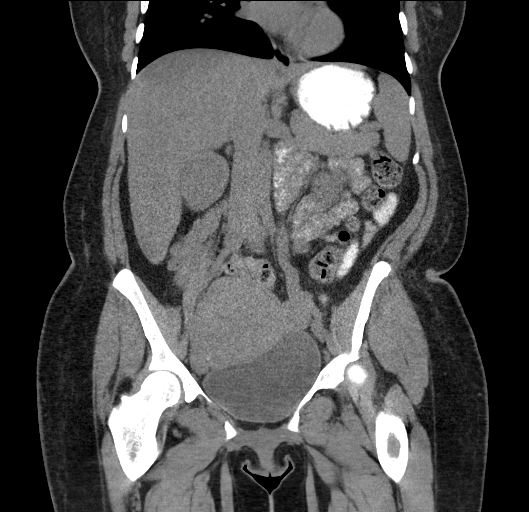

[16 of 46 positions shown; findings below may reference images not displayed]

FINDINGS: Lower chest: No acute pleural or parenchymal lung disease.

Hepatobiliary: Heterogeneous decreased attenuation of the liver
consistent with hepatic steatosis. Evaluation of the liver
parenchyma is limited without IV contrast. Gallbladder is
unremarkable. No biliary dilation.

Pancreas: Unremarkable. No pancreatic ductal dilatation or
surrounding inflammatory changes.

Spleen: Normal in size without focal abnormality.

Adrenals/Urinary Tract: There are 2 distinct nonobstructing left
renal calculi, largest measuring 3 mm lower pole reference image 31.
No right-sided calculi. Bladder is unremarkable. The adrenals are
normal.

Stomach/Bowel: Normal appendix right lower quadrant. No bowel
obstruction or ileus. No bowel wall thickening or inflammatory
change.

Vascular/Lymphatic: No significant vascular findings are present. No
enlarged abdominal or pelvic lymph nodes.

Reproductive: Uterus is enlarged consistent with recent gravid
state. Evaluation of the uterine parenchyma is limited without IV
contrast.

I do not see any adnexal masses.

Other: No free fluid or free gas. Small umbilical fat containing
hernia. Postsurgical changes lower anterior abdominal wall from low
transverse incision for Caesarean section. No abdominal wall fluid
collections or abscess. Minimal residual subcutaneous gas.

Musculoskeletal: No acute or destructive bony lesions. Mild widening
of the pubic symphysis is likely related to recent pregnancy and
ligamentous laxity. Reconstructed images demonstrate no additional
findings.
IMPRESSION: 1. Limited evaluation without IV contrast.
2. Punctate nonobstructing left renal calculi.
3. Enlarged uterus consistent with recent gravid state.
4. Heterogeneous decreased attenuation of the liver consistent with
hepatic steatosis.
5. Normal appendix.
6. Expected postsurgical changes from recent cesarean section. No
evidence of complication.

## 2021-07-15 ENCOUNTER — Other Ambulatory Visit: Payer: Self-pay

## 2021-07-15 ENCOUNTER — Ambulatory Visit
Admission: EM | Admit: 2021-07-15 | Discharge: 2021-07-15 | Disposition: A | Discharge status: Home / Self Care | Payer: Medicaid Other | Attending: Emergency Medicine | Admitting: Emergency Medicine

## 2021-07-15 DIAGNOSIS — M62838 Other muscle spasm: Secondary | ICD-10-CM

## 2021-07-15 DIAGNOSIS — M79622 Pain in left upper arm: Secondary | ICD-10-CM | POA: Diagnosis not present

## 2021-07-15 DIAGNOSIS — M5410 Radiculopathy, site unspecified: Secondary | ICD-10-CM

## 2021-07-15 MED ORDER — BACLOFEN 10 MG PO TABS
10.0000 mg | ORAL_TABLET | Freq: Three times a day (TID) | ORAL | 0 refills | Status: AC
Start: 2021-07-15 — End: 2021-07-25

## 2021-07-15 MED ORDER — METHYLPREDNISOLONE SODIUM SUCC 125 MG IJ SOLR
125.0000 mg | Freq: Once | INTRAMUSCULAR | Status: AC
  Administered 2021-07-15: 125 mg via INTRAMUSCULAR

## 2021-07-15 MED ORDER — METHYLPREDNISOLONE 4 MG PO TABS: ORAL_TABLET | ORAL | 0 refills | Status: AC

## 2021-07-15 MED ORDER — KETOROLAC TROMETHAMINE 60 MG/2ML IM SOLN
60.0000 mg | Freq: Once | INTRAMUSCULAR | Status: AC
  Administered 2021-07-15: 60 mg via INTRAMUSCULAR

## 2021-07-15 NOTE — Discharge Instructions (Addendum)
You have significant muscle spasm in your upper trapezius that has started to pinch the nerves around your arm.    To treat your immediate pain, you received an injection of ketorolac, high-dose nonsteroidal anti-inflammatory, is not narcotic and not habit-forming, it will not make you feel sleepy.    To begin to address the significant spasm and pinched nerve, you were provided with a second injection of a steroid called methylprednisolone in the form of Solu-Medrol.    Tomorrow morning, please begin taking the methylprednisolone tablets exactly as directed.  This is a tapering dose meaning that every day you take a little less than the day before.  If you find that you have complete relief of your symptoms before you finish the entire course of methylprednisolone, please do not feel obligated to finish them.  I usually advise people to at least take their scheduled doses for 1 or 2 more days after they feel relieved.  Finally, I recommend that you take a muscle relaxer called baclofen.  I prescribed you 10 mg tablets to be taken 3 times daily for the next 10 days (30 tablets).  I do not believe you will need to use it this patient for the full 10 days.  If you find that baclofen makes you very sleepy, you can break the tablets in half to decrease the dose to 5 mg in the daytime.  If you feel that 10 mg is not enough to help you relax and sleep at nighttime, you can take 2 tablets for 20 mg dose before bedtime; please do not take more than 20 mg at each dose.  If you have not had complete relief of your symptoms in the next 9 days, or if you feel your symptoms are getting worse before the end of 9 days, please return to urgent care for repeat evaluation or another injection of ketorolac.  You are also welcome to also follow-up with your primary care provider.  If you do not have a primary care provider, we will assist you in locating one.  Thank you for visiting urgent care today, I hope you and your  family have a wonderful Thanksgiving.    Remember: Fear is in mind killer.

## 2021-07-15 NOTE — ED Triage Notes (Signed)
Pt states she has left arm/ hand pain. Pt states the pain is making her neck hurt, pt stats she is not able to move hand or neck without pain.  Start: 5 days ago but has worsened.

## 2021-07-15 NOTE — ED Provider Notes (Signed)
[facility]    CSN: [id number] Arrival date & time: 07/15/21  0919    No chief complaint on file.  HPI Janet Mckee is a 25 y.o. female. Pt states she has left arm/ hand pain. Pt states the pain is making her neck hurt, pt stats she is not able to move hand or neck without pain, feels that her fingers are numb and sensitive to pain as well.  Patient states she was unable to dress herself this morning and had asked her sister to help her.  Patient states he has never had pain like this before.  Patient denies trauma to her left shoulder, new activities.  Patient states she has been preparing her household for these giving by cleaning and preliminary cooking.  Patient states pain began 5 days ago and has gotten progressively worse.  Per my observation, when I enter the room patient is tearful.  The history is provided by the patient.  Past Medical History:  Diagnosis Date   Depression    PP with 2nd child, ok child   Gestational diabetes    Infection    UTI   Ovarian cyst    Patient Active Problem List   Diagnosis Date Noted   Abdominal pain postpartum 12/04/2019   Abdominal pain during pregnancy, unspecified trimester 12/04/2019   Encounter for maternal care for low transverse scar from previous cesarean delivery 11/26/2019   Delivery by elective cesarean section 11/26/2019   Status post primary low transverse cesarean section 11/26/2019   Abnormal glucose tolerance test (GTT) during pregnancy, antepartum 10/24/2019   Past Surgical History:  Procedure Laterality Date   CESAREAN SECTION Bilateral 11/26/2019   Procedure: Cesarean section ;  Surgeon: Essie Hart, MD;  Location: [facility];  Service: Obstetrics;  Laterality: Bilateral;   KNEE SURGERY Bilateral    patellar realignment   TONSILLECTOMY     OB History     Gravida  3   Para  3   Term  3   Preterm  0   AB  0   Living  3      SAB      IAB      Ectopic      Multiple  0   Live Births  3           Home Medications    Prior to Admission medications   Medication Sig Start Date End Date Taking? Authorizing Provider  baclofen (LIORESAL) 10 MG tablet Take 1 tablet (10 mg total) by mouth 3 (three) times daily for 10 days. 07/15/21 07/25/21 Yes Theadora Rama Scales, PA-C  methylPREDNISolone (MEDROL) 4 MG tablet Take 9 tablets (36 mg total) by mouth daily for 1 day, THEN 8 tablets (32 mg total) daily for 1 day, THEN 7 tablets (28 mg total) daily for 1 day, THEN 6 tablets (24 mg total) daily for 1 day, THEN 5 tablets (20 mg total) daily for 1 day, THEN 4 tablets (16 mg total) daily for 1 day, THEN 3 tablets (12 mg total) daily for 1 day, THEN 2 tablets (8 mg total) daily for 1 day, THEN 1 tablet (4 mg total) daily for 1 day. 07/15/21 07/24/21 Yes Theadora Rama Scales, PA-C  ibuprofen (ADVIL) 800 MG tablet Take 1 tablet (800 mg total) by mouth every 6 (six) hours. 11/28/19   Essie Hart, MD  iron polysaccharides (NIFEREX) 150 MG capsule Take 1 capsule (150 mg total) by mouth daily. 11/28/19   Essie Hart, MD  Prenatal Vit-Fe Fumarate-FA (PRENATAL MULTIVITAMIN) TABS tablet Take 1 tablet by mouth daily at 12 noon.    [provider]  senna-docusate (SENOKOT-S) 8.6-50 MG tablet Take 2 tablets by mouth at bedtime as needed for mild constipation. 11/28/19   Essie Hart, MD   Family History Family History  Problem Relation Age of Onset   Anemia Mother    Social History Social History   Tobacco Use   Smoking status: Never   Smokeless tobacco: Never  Vaping Use   Vaping Use: Never used  Substance Use Topics   Alcohol use: Never   Drug use: Never   Allergies   Contrast media [iodinated diagnostic agents] and Sulfa antibiotics  Review of Systems Review of Systems Pertinent findings noted in history of present illness.   Physical Exam Triage Vital Signs ED Triage Vitals  Enc Vitals Group     BP 06/19/21 0827 (!) 147/82     Pulse Rate 06/19/21 0827 72     Resp 06/19/21  0827 18     Temp 06/19/21 0827 98.3 F (36.8 C)     Temp Source 06/19/21 0827 Oral     SpO2 06/19/21 0827 98 %     Weight --      Height --      Head Circumference --      Peak Flow --      Pain Score 06/19/21 0826 5     Pain Loc --      Pain Edu? --      Excl. in GC? --   No data found.  Updated Vital Signs BP (!) 130/97 (BP Location: Right Arm)   Pulse 94   Temp 98.6 F (37 C) (Oral)   Resp 18   LMP  (LMP Unknown)   SpO2 96%   Physical Exam Vitals and nursing note reviewed.  Constitutional:      General: She is not in acute distress.    Appearance: Normal appearance. She is not ill-appearing.  HENT:     Head: Normocephalic and atraumatic.  Eyes:     General: Lids are normal.        Right eye: No discharge.        Left eye: No discharge.     Extraocular Movements: Extraocular movements intact.     Conjunctiva/sclera: Conjunctivae normal.     Right eye: Right conjunctiva is not injected.     Left eye: Left conjunctiva is not injected.  Neck:     Trachea: Trachea and phonation normal.  Cardiovascular:     Rate and Rhythm: Normal rate and regular rhythm.     Pulses: Normal pulses.     Heart sounds: Normal heart sounds. No murmur heard.   No friction rub. No gallop.  Pulmonary:     Effort: Pulmonary effort is normal. No accessory muscle usage, prolonged expiration or respiratory distress.     Breath sounds: Normal breath sounds. No stridor, decreased air movement or transmitted upper airway sounds. No decreased breath sounds, wheezing, rhonchi or rales.  Chest:     Chest wall: No tenderness.  Musculoskeletal:        General: Tenderness (Left cervicals paraspinous muscles, upper left trapezius) present. Normal range of motion.     Cervical back: Normal range of motion and neck supple. Normal range of motion.  Lymphadenopathy:     Cervical: No cervical adenopathy.  Skin:    General: Skin is warm and dry.     Findings: No erythema or rash.  Neurological:      General: No focal deficit present.     Mental Status: She is alert and oriented to person, place, and time. Mental status is at baseline.     Cranial Nerves: No cranial nerve deficit.     Sensory: No sensory deficit.     Coordination: Coordination normal.     Gait: Gait normal.     Deep Tendon Reflexes: Reflexes normal.  Psychiatric:        Mood and Affect: Mood is anxious. Affect is tearful.        Speech: Speech normal.        Behavior: Behavior normal. Behavior is cooperative.        Thought Content: Thought content normal.        Cognition and Memory: Cognition normal.        Judgment: Judgment normal.    Visual Acuity Right Eye Distance:   Left Eye Distance:   Bilateral Distance:    Right Eye Near:   Left Eye Near:    Bilateral Near:     [facility] Couse / Diagnostics / Procedures:    EKG  Radiology No results found.  Procedures Procedures (including critical care time)  [facility] Diagnoses / Final Clinical Impressions(s)    Final diagnoses:  Cervical paraspinous muscle spasm  Left upper arm pain  Radiculopathy affecting upper extremity   I have reviewed the triage vital signs and the nursing notes.  Pertinent labs & imaging results that were available during my care of the patient were reviewed by me and considered in my medical decision making (see chart for details).    Patient provided with injections of ketorolac and Solu-Medrol for aggressive anti-inflammatory treatment.  Patient advised to follow-up with a tapering dose of methylprednisolone and a muscle relaxer.  Prescription sent to her pharmacy.  Return precautions advised.  Patient counseled regarding fear of pain, reversibility of her current condition and anticipation of return to normal activity and movement in the next 3 to 5 days.  Patient requests a splint for her left arm to keep from having to move her shoulder, I provided this at her request but advised her to use it as little as  needed.  Patient/parent/caregiver verbalized understanding and agreement of plan as discussed.  All questions were addressed during visit.  Please see discharge instructions below for further details of plan.  ED Prescriptions     Medication Sig Dispense Auth. Provider   methylPREDNISolone (MEDROL) 4 MG tablet Take 9 tablets (36 mg total) by mouth daily for 1 day, THEN 8 tablets (32 mg total) daily for 1 day, THEN 7 tablets (28 mg total) daily for 1 day, THEN 6 tablets (24 mg total) daily for 1 day, THEN 5 tablets (20 mg total) daily for 1 day, THEN 4 tablets (16 mg total) daily for 1 day, THEN 3 tablets (12 mg total) daily for 1 day, THEN 2 tablets (8 mg total) daily for 1 day, THEN 1 tablet (4 mg total) daily for 1 day. 45 tablet Theadora Rama Scales, PA-C   baclofen (LIORESAL) 10 MG tablet Take 1 tablet (10 mg total) by mouth 3 (three) times daily for 10 days. 30 each Theadora Rama Scales, PA-C      PDMP not reviewed this encounter.  Pending results:  Labs Reviewed - No data to display   Medications Ordered in [facility]: Medications  ketorolac (TORADOL) injection 60 mg (60 mg Intramuscular Given 07/15/21 1108)  methylPREDNISolone sodium succinate (SOLU-MEDROL) 125 mg/2 mL  injection 125 mg (125 mg Intramuscular Given 07/15/21 1147)    Discharge Instructions:   Discharge Instructions      You have significant muscle spasm in your upper trapezius that has started to pinch the nerves around your arm.    To treat your immediate pain, you received an injection of ketorolac, high-dose nonsteroidal anti-inflammatory, is not narcotic and not habit-forming, it will not make you feel sleepy.    To begin to address the significant spasm and pinched nerve, you were provided with a second injection of a steroid called methylprednisolone in the form of Solu-Medrol.    Tomorrow morning, please begin taking the methylprednisolone tablets exactly as directed.  This is a tapering dose meaning that  every day you take a little less than the day before.  If you find that you have complete relief of your symptoms before you finish the entire course of methylprednisolone, please do not feel obligated to finish them.  I usually advise people to at least take their scheduled doses for 1 or 2 more days after they feel relieved.  Finally, I recommend that you take a muscle relaxer called baclofen.  I prescribed you 10 mg tablets to be taken 3 times daily for the next 10 days (30 tablets).  I do not believe you will need to use it this patient for the full 10 days.  If you find that baclofen makes you very sleepy, you can break the tablets in half to decrease the dose to 5 mg in the daytime.  If you feel that 10 mg is not enough to help you relax and sleep at nighttime, you can take 2 tablets for 20 mg dose before bedtime; please do not take more than 20 mg at each dose.  If you have not had complete relief of your symptoms in the next 9 days, or if you feel your symptoms are getting worse before the end of 9 days, please return to urgent care for repeat evaluation or another injection of ketorolac.  You are also welcome to also follow-up with your primary care provider.  If you do not have a primary care provider, we will assist you in locating one.  Thank you for visiting urgent care today, I hope you and your family have a wonderful Thanksgiving.    Remember: Fear is in mind killer.       HISTORY   Disposition Upon Discharge:  Patient presented with an acute illness with associated systemic symptoms and significant discomfort requiring urgent management. In my opinion, this is a condition that a prudent lay person (someone who possesses an average knowledge of health and medicine) may potentially expect to result in complications if not addressed urgently such as respiratory distress, impairment of bodily function or dysfunction of bodily organs.   Routine symptom specific, illness specific  and/or disease specific instructions were discussed with the patient and/or caregiver at length.   As such, the patient has been evaluated and assessed, work-up was performed and treatment was provided in alignment with urgent care protocols and evidence based medicine.  Patient/parent/caregiver has been advised that the patient may require follow up for further testing and treatment if the symptoms continue in spite of treatment, as clinically indicated and appropriate.  If the patient was tested for COVID-19, Influenza and/or RSV, then the patient/parent/guardian was advised to isolate at home pending the results of his/her diagnostic coronavirus test and potentially longer if they're positive. I have also advised pt that if his/her  COVID-19 test returns positive, it's recommended to self-isolate for at least 10 days after symptoms first appeared AND until fever-free for 24 hours without fever reducer AND other symptoms have improved or resolved. Discussed self-isolation recommendations as well as instructions for household member/close contacts as per the [facility] and [place] [facility], and also gave patient the COVID packet with this information.  Patient/parent/caregiver has been advised to return to the [facility] or PCP in 3-5 days if no better; to PCP or the Emergency Department if new signs and symptoms develop, or if the current signs or symptoms continue to change or worsen for further workup, evaluation and treatment as clinically indicated and appropriate  The patient will follow up with their current PCP if and as advised. If the patient does not currently have a PCP we will assist them in obtaining one.   The patient may need specialty follow up if the symptoms continue, in spite of conservative treatment and management, for further workup, evaluation, consultation and treatment as clinically indicated and appropriate.  Condition: stable for discharge home Home: take medications as prescribed; routine  discharge instructions as discussed; follow up as advised.    Theadora Rama Scales, PA-C 07/15/21 1206

## 2021-07-30 ENCOUNTER — Other Ambulatory Visit: Payer: Self-pay

## 2021-07-30 ENCOUNTER — Ambulatory Visit
Admission: EM | Admit: 2021-07-30 | Discharge: 2021-07-30 | Disposition: A | Discharge status: Home / Self Care | Payer: Medicaid Other | Attending: Emergency Medicine | Admitting: Emergency Medicine

## 2021-07-30 DIAGNOSIS — R52 Pain, unspecified: Secondary | ICD-10-CM

## 2021-07-30 DIAGNOSIS — R0981 Nasal congestion: Secondary | ICD-10-CM | POA: Diagnosis not present

## 2021-07-30 DIAGNOSIS — R051 Acute cough: Secondary | ICD-10-CM | POA: Diagnosis not present

## 2021-07-30 DIAGNOSIS — Z2089 Contact with and (suspected) exposure to other communicable diseases: Secondary | ICD-10-CM

## 2021-07-30 DIAGNOSIS — R6883 Chills (without fever): Secondary | ICD-10-CM

## 2021-07-30 MED ORDER — PROMETHAZINE-DM 6.25-15 MG/5ML PO SYRP: 5.0000 mL | ORAL_SOLUTION | Freq: Four times a day (QID) | ORAL | 0 refills | Status: DC | PRN

## 2021-07-30 MED ORDER — IPRATROPIUM BROMIDE 0.06 % NA SOLN: 2.0000 | Freq: Four times a day (QID) | NASAL | 0 refills | Status: DC

## 2021-07-30 MED ORDER — OSELTAMIVIR PHOSPHATE 75 MG PO CAPS: 75.0000 mg | ORAL_CAPSULE | Freq: Two times a day (BID) | ORAL | 0 refills | Status: DC

## 2021-07-30 MED ORDER — AMOXICILLIN-POT CLAVULANATE 875-125 MG PO TABS: 1.0000 | ORAL_TABLET | Freq: Two times a day (BID) | ORAL | 0 refills | Status: DC

## 2021-07-30 NOTE — ED Triage Notes (Signed)
Pt c/o chills, cough, body aches, she states her mucus has blood.  Started 2 days ago

## 2021-07-30 NOTE — Discharge Instructions (Addendum)
For upper respiratory infection that I cannot definitively say is either bacterial or viral, I recommend that we have empirically treat you for both.  As we discussed, often viral infections are accompanied by bacterial infections sometimes this takes several days sometimes it happens almost immediately.  To treat you empirically for presumed influenza (viral upper respiratory infection), please begin Tamiflu, 1 capsule twice daily for the next 5 days.  The result of your influenza and COVID test will be available in your MyChart as soon as they are complete, this typically takes 12 to 24 hours.  If either result is positive, you will receive a phone call from our nurse with further recommendations if any.  To treat you empirically for presumed community-acquired pneumonia (bacterial upper respiratory infection), please begin Augmentin, 1 tablet twice daily for the next 7 days.  If you have significant resolution of your symptoms within 5 days, please feel free to stop after taking full 5 days.  Below are listed some medications that you can purchase over-the-counter that may assist you with symptomatic treatment.  I also provided you with a prescription for Atrovent nasal spray to dry up nasal passages and reduce runny nose and Promethazine DM which is a wonderful cough medicine that suppresses cough, addresses nausea and helps you sleep.  Thank you for visiting urgent care, I hope you feel better soon.  If you have not had relief of your symptoms within the next week, please feel free to return for repeat evaluation.

## 2021-07-30 NOTE — ED Provider Notes (Signed)
[facility]    CSN: [id number] Arrival date & time: 07/30/21  0350    HISTORY  No chief complaint on file.  HPI Janet Mckee is a 25 y.o. female. Pt c/o chills, cough, body aches, she states her mucus has blood.  Patient also endorses decreased appetite and nausea, states she has been fighting the urge to vomit.  Denies diarrhea.  States symptoms started in the evening 2 days ago.  Patient states that this past weekend she was at a funeral with over 500 attendants, states that not everyone was wearing a mask.  The history is provided by the patient.  Past Medical History:  Diagnosis Date   Depression    PP with 2nd child, ok child   Gestational diabetes    Infection    UTI   Ovarian cyst    Patient Active Problem List   Diagnosis Date Noted   Abdominal pain postpartum 12/04/2019   Abdominal pain during pregnancy, unspecified trimester 12/04/2019   Encounter for maternal care for low transverse scar from previous cesarean delivery 11/26/2019   Delivery by elective cesarean section 11/26/2019   Status post primary low transverse cesarean section 11/26/2019   Abnormal glucose tolerance test (GTT) during pregnancy, antepartum 10/24/2019   Past Surgical History:  Procedure Laterality Date   CESAREAN SECTION Bilateral 11/26/2019   Procedure: Cesarean section ;  Surgeon: Essie Hart, MD;  Location: [facility];  Service: Obstetrics;  Laterality: Bilateral;   KNEE SURGERY Bilateral    patellar realignment   TONSILLECTOMY     OB History     Gravida  3   Para  3   Term  3   Preterm  0   AB  0   Living  3      SAB      IAB      Ectopic      Multiple  0   Live Births  3          Home Medications    Prior to Admission medications   Medication Sig Start Date End Date Taking? Authorizing Provider  ibuprofen (ADVIL) 800 MG tablet Take 1 tablet (800 mg total) by mouth every 6 (six) hours. 11/28/19   Essie Hart, MD  iron polysaccharides (NIFEREX) 150  MG capsule Take 1 capsule (150 mg total) by mouth daily. 11/28/19   Essie Hart, MD  Prenatal Vit-Fe Fumarate-FA (PRENATAL MULTIVITAMIN) TABS tablet Take 1 tablet by mouth daily at 12 noon.    [provider]  senna-docusate (SENOKOT-S) 8.6-50 MG tablet Take 2 tablets by mouth at bedtime as needed for mild constipation. 11/28/19   Essie Hart, MD   Family History Family History  Problem Relation Age of Onset   Anemia Mother    Social History Social History   Tobacco Use   Smoking status: Never   Smokeless tobacco: Never  Vaping Use   Vaping Use: Never used  Substance Use Topics   Alcohol use: Never   Drug use: Never   Allergies   Contrast media [iodinated diagnostic agents] and Sulfa antibiotics  Review of Systems Review of Systems Pertinent findings noted in history of present illness.   Physical Exam Triage Vital Signs ED Triage Vitals  Enc Vitals Group     BP 06/19/21 0827 (!) 147/82     Pulse Rate 06/19/21 0827 72     Resp 06/19/21 0827 18     Temp 06/19/21 0827 98.3 F (36.8 C)  Temp Source 06/19/21 0827 Oral     SpO2 06/19/21 0827 98 %     Weight --      Height --      Head Circumference --      Peak Flow --      Pain Score 06/19/21 0826 5     Pain Loc --      Pain Edu? --      Excl. in GC? --   No data found.  Updated Vital Signs BP 112/80 (BP Location: Right Arm)   Pulse (!) 120   Temp 98.8 F (37.1 C) (Oral)   Resp 18   LMP  (LMP Unknown)   SpO2 97%   Physical Exam Constitutional:      Appearance: She is ill-appearing.  HENT:     Head: Normocephalic and atraumatic.     Salivary Glands: Right salivary gland is not diffusely enlarged or tender. Left salivary gland is not diffusely enlarged or tender.     Right Ear: Tympanic membrane, ear canal and external ear normal.     Left Ear: Tympanic membrane, ear canal and external ear normal.     Nose: Congestion and rhinorrhea present. Rhinorrhea is clear.     Right Sinus: No maxillary sinus  tenderness or frontal sinus tenderness.     Left Sinus: No maxillary sinus tenderness.     Mouth/Throat:     Mouth: Mucous membranes are moist.     Pharynx: Pharyngeal swelling, posterior oropharyngeal erythema and uvula swelling present.     Tonsils: No tonsillar exudate. 0 on the right. 0 on the left.  Cardiovascular:     Rate and Rhythm: Normal rate and regular rhythm.     Pulses: Normal pulses.  Pulmonary:     Effort: Pulmonary effort is normal. No accessory muscle usage, prolonged expiration or respiratory distress.     Breath sounds: No stridor. No wheezing, rhonchi or rales.     Comments: Turbulent breath sounds throughout without wheeze, rale, rhonchi. Abdominal:     General: Abdomen is flat. Bowel sounds are normal.     Palpations: Abdomen is soft.  Musculoskeletal:        General: Normal range of motion.  Lymphadenopathy:     Cervical: Cervical adenopathy present.     Right cervical: Superficial cervical adenopathy and posterior cervical adenopathy present.     Left cervical: Superficial cervical adenopathy and posterior cervical adenopathy present.  Skin:    General: Skin is warm and dry.  Neurological:     General: No focal deficit present.     Mental Status: She is alert and oriented to person, place, and time.     Motor: Motor function is intact.     Coordination: Coordination is intact.     Gait: Gait is intact.     Deep Tendon Reflexes: Reflexes are normal and symmetric.  Psychiatric:        Attention and Perception: Attention and perception normal.        Mood and Affect: Mood and affect normal.        Speech: Speech normal.        Behavior: Behavior normal. Behavior is cooperative.        Thought Content: Thought content normal.    Visual Acuity Right Eye Distance:   Left Eye Distance:   Bilateral Distance:    Right Eye Near:   Left Eye Near:    Bilateral Near:     UC Couse / Diagnostics / Procedures:  EKG  Radiology No results  found.  Procedures Procedures (including critical care time)  UC Diagnoses / Final Clinical Impressions(s)   I have reviewed the triage vital signs and the nursing notes.  Pertinent labs & imaging results that were available during my care of the patient were reviewed by me and considered in my medical decision making (see chart for details).   Final diagnoses:  Acute cough  Nasal congestion  Chills  Body aches  Contact with and (suspected) exposure to other communicable diseases   Physical exam findings are concerning for influenza however given patient's reports of blood in her mucus and sputum along with dark quality of both as well as her recent exposure to a large group of people, I feel it would be worthwhile to treat her empirically for both influenza and community-acquired pneumonia.  Patient advised that we will notify her of the results of her flu test once it is available, will posted to MyChart and give her a phone call if it is positive.  ED Prescriptions     Medication Sig Dispense Auth. Provider   oseltamivir (TAMIFLU) 75 MG capsule Take 1 capsule (75 mg total) by mouth every 12 (twelve) hours. 10 capsule Theadora Rama Scales, PA-C   amoxicillin-clavulanate (AUGMENTIN) 875-125 MG tablet Take 1 tablet by mouth every 12 (twelve) hours. 14 tablet Theadora Rama Scales, PA-C   ipratropium (ATROVENT) 0.06 % nasal spray Place 2 sprays into both nostrils 4 (four) times daily. As needed for nasal congestion, runny nose 15 mL Theadora Rama Scales, PA-C   promethazine-dextromethorphan (PROMETHAZINE-DM) 6.25-15 MG/5ML syrup Take 5 mLs by mouth 4 (four) times daily as needed for cough. 180 mL Theadora Rama Scales, PA-C      PDMP not reviewed this encounter.  Pending results:  Labs Reviewed  COVID-19, FLU A+B NAA    Medications Ordered in UC: Medications - No data to display  Disposition Upon Discharge:  Condition: stable for discharge home Home: take medications as  prescribed; routine discharge instructions as discussed; follow up as advised.  Patient presented with an acute illness with associated systemic symptoms and significant discomfort requiring urgent management. In my opinion, this is a condition that a prudent lay person (someone who possesses an average knowledge of health and medicine) may potentially expect to result in complications if not addressed urgently such as respiratory distress, impairment of bodily function or dysfunction of bodily organs.   Routine symptom specific, illness specific and/or disease specific instructions were discussed with the patient and/or caregiver at length.   As such, the patient has been evaluated and assessed, work-up was performed and treatment was provided in alignment with urgent care protocols and evidence based medicine.  Patient/parent/caregiver has been advised that the patient may require follow up for further testing and treatment if the symptoms continue in spite of treatment, as clinically indicated and appropriate.  The patient was tested for COVID-19, Influenza and/or RSV, then the patient/parent/guardian was advised to isolate at home pending the results of his/her diagnostic coronavirus test and potentially longer if they're positive. I have also advised pt that if his/her COVID-19 test returns positive, it's recommended to self-isolate for at least 10 days after symptoms first appeared AND until fever-free for 24 hours without fever reducer AND other symptoms have improved or resolved. Discussed self-isolation recommendations as well as instructions for household member/close contacts as per the [facility] and [place] [facility]HS, and also gave patient the COVID packet with this information.  Patient/parent/caregiver has been advised to  return to the [facility] or PCP in 3-5 days if no better; to PCP or the Emergency Department if new signs and symptoms develop, or if the current signs or symptoms continue to change or worsen for  further workup, evaluation and treatment as clinically indicated and appropriate  The patient will follow up with their current PCP if and as advised. If the patient does not currently have a PCP we will assist them in obtaining one.   The patient may need specialty follow up if the symptoms continue, in spite of conservative treatment and management, for further workup, evaluation, consultation and treatment as clinically indicated and appropriate.  Patient/parent/caregiver verbalized understanding and agreement of plan as discussed.  All questions were addressed during visit.  Please see discharge instructions below for further details of plan.  Discharge Instructions:   Discharge Instructions      For upper respiratory infection that I cannot definitively say is either bacterial or viral, I recommend that we have empirically treat you for both.  As we discussed, often viral infections are accompanied by bacterial infections sometimes this takes several days sometimes it happens almost immediately.  To treat you empirically for presumed influenza (viral upper respiratory infection), please begin Tamiflu, 1 capsule twice daily for the next 5 days.  The result of your influenza and COVID test will be available in your MyChart as soon as they are complete, this typically takes 12 to 24 hours.  If either result is positive, you will receive a phone call from our nurse with further recommendations if any.  To treat you empirically for presumed community-acquired pneumonia (bacterial upper respiratory infection), please begin Augmentin, 1 tablet twice daily for the next 7 days.  If you have significant resolution of your symptoms within 5 days, please feel free to stop after taking full 5 days.  Below are listed some medications that you can purchase over-the-counter that may assist you with symptomatic treatment.  I also provided you with a prescription for Atrovent nasal spray to dry up nasal  passages and reduce runny nose and Promethazine DM which is a wonderful cough medicine that suppresses cough, addresses nausea and helps you sleep.  Thank you for visiting urgent care, I hope you feel better soon.  If you have not had relief of your symptoms within the next week, please feel free to return for repeat evaluation.        Theadora Rama Scales, PA-C 07/30/21 1110

## 2021-08-05 LAB — COVID-19, FLU A+B NAA

## 2022-02-01 ENCOUNTER — Encounter (HOSPITAL_BASED_OUTPATIENT_CLINIC_OR_DEPARTMENT_OTHER): Payer: Self-pay | Admitting: Orthopaedic Surgery

## 2022-02-01 ENCOUNTER — Other Ambulatory Visit: Payer: Self-pay

## 2022-02-01 NOTE — Progress Notes (Signed)

## 2022-02-03 NOTE — Discharge Instructions (Addendum)
Ophelia Charter MD, MPH Noemi Chapel, PA-C [facility] [address] [phone] (tel)   [phone] (fax)   [place] may remove the Operative Dressing on Post-Op Day #3 (72hrs after surgery).   - Alternatively if you would like you can leave dressing on until follow-up if within 7-8 days but keep it dry. - Leave steri-strips in place until they fall off on their own, usually 2 weeks postop. - An ACE wrap may be used to control swelling, do not wrap this too tight.  If the initial ACE wrap feels too tight you may loosen it. - There may be a small amount of fluid/bleeding leaking at the surgical site.  - This is normal; the knee is filled with fluid during the procedure and can leak for 24-48hrs after surgery.  - You may change/reinforce the bandage as needed.  - Use the Cryocuff or Ice as often as possible for the first 7 days, then as needed for pain relief. Always keep a towel, ACE wrap or other barrier between the cooling unit and your skin.  - You may shower on Post-Op Day #3. Gently pat the area dry. Do not soak the knee in water or submerge it.  - Do not go swimming in the pool or ocean until 4 weeks after surgery or when otherwise instructed.  Keep incisions as dry as possible.   BRACE/AMBULATION - You will be placed in a brace post-operatively.  - Wear your brace at all times until follow-up.  - You may remove for hygiene. -           Use crutches to help you ambulate -           Touch-down weight bearing: when you stand or walk, you may only touch your foot to the floor for balance -           Do NOT put any body weight on your leg  PHYSICAL THERAPY - You will begin physical therapy soon after surgery  - Please call to set up an appointment, if you do not already have one  - Let our office if there are any issues with scheduling your therapy  - A hard copy of your physical therapy prescription and  physical therapy protocol was provided to you today - You should contact your physical therapy office of choice to set up an appointment if you have not already done so  REGIONAL ANESTHESIA (NERVE BLOCKS) - The anesthesia team may have performed a nerve block for you if safe in the setting of your care.  This is a great tool used to minimize pain.  Typically the block may start wearing off overnight.  This can be a challenging period but please utilize your as needed pain medications to try and manage this period and know it will be a brief transition as the nerve block wears completely   POST-OP MEDICATIONS - Multimodal approach to pain control - In general your pain will be controlled with a combination of substances.  Prescriptions unless otherwise discussed are electronically sent to your pharmacy.  This is a carefully made plan we use to minimize narcotic use.     - Meloxicam - Anti-inflammatory medication taken on a scheduled basis - Acetaminophen - Non-narcotic pain medicine taken on a scheduled basis  - Oxycodone - This is a strong narcotic, to be used only on an "as needed" basis for SEVERE pain. - Aspirin 81mg  -  This medicine is used to minimize the risk of blood clots after surgery. - Zofran - take as needed for nausea  Robaxin - this is a muscle relaxer, take as needed for muscle spasms  FOLLOW-UP   Please call the office to schedule a follow-up appointment for your incision check, 7-10 days post-operatively.  IF YOU HAVE ANY QUESTIONS, PLEASE FEEL FREE TO CALL OUR OFFICE.   HELPFUL INFORMATION  - If you had a block, it will wear off between 8-24 hrs postop typically.  This is period when your pain may go from nearly zero to the pain you would have had post-op without the block.  This is an abrupt transition but nothing dangerous is happening.  You may take an extra dose of narcotic when this happens.   Keep your leg elevated to decrease swelling, which will then in turn  decrease your pain. I would elevate the foot of your bed by putting a couple of couch pillows between your mattress and box spring. I would not keep pillow directly under your ankle.  - Do not sleep with a pillow behind your knee even if it is more comfortable as this may make it harder to get your knee fully straight long term.   There will be MORE swelling on days 1-3 than there is on the day of surgery.  This also is normal. The swelling will decrease with the anti-inflammatory medication, ice and keeping it elevated. The swelling will make it more difficult to bend your knee. As the swelling goes down your motion will become easier   You may develop swelling and bruising that extends from your knee down to your calf and perhaps even to your foot over the next week. Do not be alarmed. This too is normal, and it is due to gravity   There may be some numbness adjacent to the incision site. This may last for 6-12 months or longer in some patients and is expected.   You may return to sedentary work/school in the next couple of days when you feel up to it. You will need to keep your leg elevated as much as possible    You should wean off your narcotic medicines as soon as you are able.  Most patients will be off or using minimal narcotics before their first postop appointment.    We suggest you use the pain medication the first night prior to going to bed, in order to ease any pain when the anesthesia wears off. You should avoid taking pain medications on an empty stomach as it will make you nauseous.   Do not drink alcoholic beverages or take illicit drugs when taking pain medications.   It is against the law to drive while taking narcotics. You cannot drive if your Right leg is in brace locked in extension.   Pain medication may make you constipated.  Below are a few solutions to try in this order:  o Decrease the amount of pain medication if you aren't having pain.  o Drink lots of  decaffeinated fluids.  o Drink prune juice and/or each dried prunes   o If the first 3 don't work start with additional solutions  o Take Colace - an over-the-counter stool softener  o Take Senokot - an over-the-counter laxative  o Take Miralax - a stronger over-the-counter laxative   For more information including helpful videos and documents visit our website:   [email or web address].[email or web address]-information.[email or web address]  May take NSAIDS (ibuprofen, motrin) after 6:45pm, if needed May  take Tylenol after 5:35pm, if needed.     Post Anesthesia Home Care Instructions  Activity: Get plenty of rest for the remainder of the day. A responsible individual must stay with you for 24 hours following the procedure.  For the next 24 hours, DO NOT: -Drive a car -Paediatric nurse -Drink alcoholic beverages -Take any medication unless instructed by your physician -Make any legal decisions or sign important papers.  Meals: Start with liquid foods such as gelatin or soup. Progress to regular foods as tolerated. Avoid greasy, spicy, heavy foods. If nausea and/or vomiting occur, drink only clear liquids until the nausea and/or vomiting subsides. Call your physician if vomiting continues.  Special Instructions/Symptoms: Your throat may feel dry or sore from the anesthesia or the breathing tube placed in your throat during surgery. If this causes discomfort, gargle with warm salt water. The discomfort should disappear within 24 hours.  If you had a scopolamine patch placed behind your ear for the management of post- operative nausea and/or vomiting:  1. The medication in the patch is effective for 72 hours, after which it should be removed.  Wrap patch in a tissue and discard in the trash. Wash hands thoroughly with soap and water. 2. You may remove the patch earlier than 72 hours if you experience unpleasant side effects which may include dry mouth, dizziness or visual disturbances. 3. Avoid touching  the patch. Wash your hands with soap and water after contact with the patch.

## 2022-02-03 NOTE — H&P (Signed)
PREOPERATIVE H&P  Chief Complaint: RIGHT KNEE SUBLUXATION,COMPARTMENT SYNDROME, CHONDROMALACIA PATELLA  HPI: Janet Mckee is a 26 y.o. female who is scheduled for, Procedure(s): KNEE RECONSTRUCTION/MPPFL FULKERSON SLIDE FASCIOTOMY CHONDROPLASTY.   Patient has a past medical history significant for PONV.   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  [place]  She had an acute traumatic issue and dislocated the patella again in the last 2-3 weeks.  She now feels her knee is sliding out of place even with daily activities.  She has tried a brace and symptoms in the knee are getting worse.    Symptoms are rated as moderate to severe, and have been worsening.  This is significantly impairing activities of daily living.    Please see clinic note for further details on this patient's care.    She has elected for surgical management.   Past Medical History:  Diagnosis Date   Depression    PP with 2nd child, ok child   Gestational diabetes    Infection    UTI   Ovarian cyst    PONV (postoperative nausea and vomiting)    Past Surgical History:  Procedure Laterality Date   CESAREAN SECTION Bilateral 11/26/2019   Procedure: Cesarean section ;  Surgeon: Essie Hart, MD;  Location: [facility];  Service: Obstetrics;  Laterality: Bilateral;   KNEE SURGERY Bilateral    patellar realignment   TONSILLECTOMY     Social History   Socioeconomic History   Marital status: Single    Spouse name: Not on file   Number of children: Not on file   Years of education: Not on file   Highest education level: Not on file  Occupational History   Not on file  Tobacco Use   Smoking status: Never   Smokeless tobacco: Never  Vaping Use   Vaping Use: Never used  Substance and Sexual Activity   Alcohol use: Never   Drug use: Never   Sexual activity: Yes  Other Topics Concern   Not on  file  Social History Narrative   Not on file   Social Determinants of Health   Financial Resource Strain: Not on file  Food Insecurity: Not on file  Transportation Needs: Not on file  Physical Activity: Not on file  Stress: Not on file  Social Connections: Not on file   Family History  Problem Relation Age of Onset   Anemia Mother    Allergies  Allergen Reactions   Contrast Media [Iodinated Contrast Media] Hives   Sulfa Antibiotics Hives   Prior to Admission medications   Medication Sig Start Date End Date Taking? Authorizing Provider  levonorgestrel-ethinyl estradiol (ALESSE) 0.1-20 MG-MCG tablet Take 1 tablet by mouth daily.   Yes [provider]    ROS: All other systems have been reviewed and were otherwise negative with the exception of those mentioned in the HPI and as above.  Physical Exam: General: Alert, no acute distress Cardiovascular: No pedal edema Respiratory: No cyanosis, no use of accessory musculature GI: No organomegaly, abdomen is soft and non-tender Skin: No lesions in the area of chief complaint Neurologic: Sensation intact distally Psychiatric: Patient is competent for consent with normal mood and affect Lymphatic: No axillary or cervical lymphadenopathy  MUSCULOSKELETAL:  Significant apprehension with lateral translation of the patella.  Not able to get full extension.  She has apprehension during range of motion.  Distal motor and sensory function are intact.  She has a well healed incision that is oblique and contoured around the medial aspect of the patella.  There is no medial incision over the femur.    Imaging: Long leg and outlet views demonstrate the mechanical axis is through the medial aspect of the lateral plateau.   MRI is reviewed demonstrating a relatively Type C trochlea. There is lateral tracking of the patella, obviously. TT:TG is 13. Caton-Deschamps ratio is 1.6.   Assessment: RIGHT KNEE SUBLUXATION,COMPARTMENT  SYNDROME, CHONDROMALACIA PATELLA  Plan: Plan for Procedure(s): KNEE RECONSTRUCTION/MPPFL FULKERSON SLIDE FASCIOTOMY CHONDROPLASTY  The risks benefits and alternatives were discussed with the patient including but not limited to the risks of nonoperative treatment, versus surgical intervention including infection, bleeding, nerve injury,  blood clots, cardiopulmonary complications, morbidity, mortality, among others, and they were willing to proceed.   The patient acknowledged the explanation, agreed to proceed with the plan and consent was signed.   Operative Plan: Right knee scope with anterior medialization of the tubercle, anterior compartment release and medial patellofemoral ligament reconstruction.  Discharge Medications: Standard DVT Prophylaxis: Aspirin Physical Therapy: Outpatient PT Special Discharge needs: [place] (order). IceMan   Vernetta Honey, PA-C  02/03/2022 11:18 AM

## 2022-02-04 ENCOUNTER — Ambulatory Visit (HOSPITAL_BASED_OUTPATIENT_CLINIC_OR_DEPARTMENT_OTHER): Payer: Medicaid Other | Admitting: Anesthesiology

## 2022-02-04 ENCOUNTER — Encounter (HOSPITAL_BASED_OUTPATIENT_CLINIC_OR_DEPARTMENT_OTHER): Payer: Self-pay | Admitting: Orthopaedic Surgery

## 2022-02-04 ENCOUNTER — Ambulatory Visit (HOSPITAL_BASED_OUTPATIENT_CLINIC_OR_DEPARTMENT_OTHER)
Admission: RE | Admit: 2022-02-04 | Discharge: 2022-02-04 | Disposition: A | Discharge status: Home / Self Care | Payer: Medicaid Other | Attending: Orthopaedic Surgery | Admitting: Orthopaedic Surgery

## 2022-02-04 ENCOUNTER — Other Ambulatory Visit: Payer: Self-pay

## 2022-02-04 ENCOUNTER — Encounter (HOSPITAL_BASED_OUTPATIENT_CLINIC_OR_DEPARTMENT_OTHER)
Admission: RE | Disposition: A | Discharge status: Home / Self Care | Payer: Self-pay | Source: Home / Self Care | Attending: Orthopaedic Surgery

## 2022-02-04 ENCOUNTER — Ambulatory Visit (HOSPITAL_COMMUNITY): Payer: Medicaid Other

## 2022-02-04 DIAGNOSIS — M25361 Other instability, right knee: Secondary | ICD-10-CM

## 2022-02-04 DIAGNOSIS — X58XXXA Exposure to other specified factors, initial encounter: Secondary | ICD-10-CM | POA: Diagnosis not present

## 2022-02-04 DIAGNOSIS — T79A21A Traumatic compartment syndrome of right lower extremity, initial encounter: Secondary | ICD-10-CM | POA: Diagnosis not present

## 2022-02-04 DIAGNOSIS — M2241 Chondromalacia patellae, right knee: Secondary | ICD-10-CM | POA: Insufficient documentation

## 2022-02-04 DIAGNOSIS — Z01818 Encounter for other preprocedural examination: Secondary | ICD-10-CM

## 2022-02-04 DIAGNOSIS — S83101A Unspecified subluxation of right knee, initial encounter: Secondary | ICD-10-CM | POA: Diagnosis not present

## 2022-02-04 HISTORY — PX: KNEE RECONSTRUCTION: SHX5883

## 2022-02-04 HISTORY — PX: FULKERSON SLIDE: SHX5018

## 2022-02-04 HISTORY — PX: FASCIOTOMY: SHX132

## 2022-02-04 HISTORY — PX: CHONDROPLASTY: SHX5177

## 2022-02-04 HISTORY — DX: Other specified postprocedural states: Z98.890

## 2022-02-04 LAB — POCT PREGNANCY, URINE: Preg Test, Ur: NEGATIVE

## 2022-02-04 SURGERY — RECONSTRUCTION, KNEE
Anesthesia: General | Site: Knee | Laterality: Right

## 2022-02-04 MED ORDER — KETOROLAC TROMETHAMINE 30 MG/ML IJ SOLN
INTRAMUSCULAR | Status: DC | PRN
  Administered 2022-02-04: 30 mg via INTRAVENOUS

## 2022-02-04 MED ORDER — FENTANYL CITRATE (PF) 100 MCG/2ML IJ SOLN
INTRAMUSCULAR | Status: AC
  Filled 2022-02-04: qty 2

## 2022-02-04 MED ORDER — CEFAZOLIN SODIUM-DEXTROSE 2-4 GM/100ML-% IV SOLN
2.0000 g | INTRAVENOUS | Status: AC
  Administered 2022-02-04: 2 g via INTRAVENOUS

## 2022-02-04 MED ORDER — SODIUM CHLORIDE 0.9 % IR SOLN
Status: DC | PRN
  Administered 2022-02-04: 3000 mL

## 2022-02-04 MED ORDER — HYDROMORPHONE HCL 1 MG/ML IJ SOLN
INTRAMUSCULAR | Status: AC
  Filled 2022-02-04: qty 0.5

## 2022-02-04 MED ORDER — HYDROMORPHONE HCL 1 MG/ML IJ SOLN
INTRAMUSCULAR | Status: DC | PRN
  Administered 2022-02-04: .5 mg via INTRAVENOUS

## 2022-02-04 MED ORDER — ONDANSETRON HCL 4 MG/2ML IJ SOLN
INTRAMUSCULAR | Status: AC
  Filled 2022-02-04: qty 2

## 2022-02-04 MED ORDER — LIDOCAINE HCL (CARDIAC) PF 100 MG/5ML IV SOSY
PREFILLED_SYRINGE | INTRAVENOUS | Status: DC | PRN
  Administered 2022-02-04: 100 mg via INTRATRACHEAL

## 2022-02-04 MED ORDER — ASPIRIN 81 MG PO CHEW: 81.0000 mg | CHEWABLE_TABLET | Freq: Two times a day (BID) | ORAL | 0 refills | Status: AC

## 2022-02-04 MED ORDER — ACETAMINOPHEN 10 MG/ML IV SOLN
INTRAVENOUS | Status: DC | PRN
  Administered 2022-02-04: 1000 mg via INTRAVENOUS

## 2022-02-04 MED ORDER — MIDAZOLAM HCL 2 MG/2ML IJ SOLN
INTRAMUSCULAR | Status: AC
  Filled 2022-02-04: qty 2

## 2022-02-04 MED ORDER — ONDANSETRON HCL 4 MG/2ML IJ SOLN
INTRAMUSCULAR | Status: DC | PRN
  Administered 2022-02-04: 4 mg via INTRAVENOUS

## 2022-02-04 MED ORDER — KETAMINE HCL 10 MG/ML IJ SOLN
INTRAMUSCULAR | Status: DC | PRN
  Administered 2022-02-04 (×2): 20 mg via INTRAVENOUS

## 2022-02-04 MED ORDER — PROPOFOL 500 MG/50ML IV EMUL
INTRAVENOUS | Status: DC | PRN
  Administered 2022-02-04: 175 ug/kg/min via INTRAVENOUS

## 2022-02-04 MED ORDER — VANCOMYCIN HCL 1 G IV SOLR
INTRAVENOUS | Status: DC | PRN
  Administered 2022-02-04: 1000 mg via TOPICAL

## 2022-02-04 MED ORDER — PROPOFOL 10 MG/ML IV BOLUS
INTRAVENOUS | Status: AC
  Filled 2022-02-04: qty 20

## 2022-02-04 MED ORDER — OXYCODONE HCL 5 MG PO TABS: ORAL_TABLET | ORAL | 0 refills | Status: AC

## 2022-02-04 MED ORDER — METHOCARBAMOL 500 MG PO TABS: 500.0000 mg | ORAL_TABLET | Freq: Three times a day (TID) | ORAL | 0 refills | Status: DC | PRN

## 2022-02-04 MED ORDER — ACETAMINOPHEN 10 MG/ML IV SOLN
INTRAVENOUS | Status: AC
  Filled 2022-02-04: qty 100

## 2022-02-04 MED ORDER — HYDROMORPHONE HCL 1 MG/ML IJ SOLN
0.2500 mg | INTRAMUSCULAR | Status: DC | PRN
  Administered 2022-02-04 (×3): 0.5 mg via INTRAVENOUS

## 2022-02-04 MED ORDER — CEFAZOLIN SODIUM-DEXTROSE 2-4 GM/100ML-% IV SOLN
INTRAVENOUS | Status: AC
  Filled 2022-02-04: qty 100

## 2022-02-04 MED ORDER — MIDAZOLAM HCL 5 MG/5ML IJ SOLN
INTRAMUSCULAR | Status: DC | PRN
  Administered 2022-02-04: 2 mg via INTRAVENOUS

## 2022-02-04 MED ORDER — SCOPOLAMINE 1 MG/3DAYS TD PT72
MEDICATED_PATCH | TRANSDERMAL | Status: DC | PRN
  Administered 2022-02-04: 1 via TRANSDERMAL

## 2022-02-04 MED ORDER — OXYCODONE HCL 5 MG PO TABS
5.0000 mg | ORAL_TABLET | Freq: Once | ORAL | Status: AC
  Administered 2022-02-04: 5 mg via ORAL

## 2022-02-04 MED ORDER — KETOROLAC TROMETHAMINE 30 MG/ML IJ SOLN
INTRAMUSCULAR | Status: AC
  Filled 2022-02-04: qty 1

## 2022-02-04 MED ORDER — AMISULPRIDE (ANTIEMETIC) 5 MG/2ML IV SOLN
INTRAVENOUS | Status: AC
  Filled 2022-02-04: qty 4

## 2022-02-04 MED ORDER — BUPIVACAINE HCL (PF) 0.25 % IJ SOLN
INTRAMUSCULAR | Status: DC | PRN
  Administered 2022-02-04: 20 mL

## 2022-02-04 MED ORDER — DEXAMETHASONE SODIUM PHOSPHATE 10 MG/ML IJ SOLN
INTRAMUSCULAR | Status: DC | PRN
  Administered 2022-02-04: 10 mg via INTRAVENOUS

## 2022-02-04 MED ORDER — ACETAMINOPHEN 500 MG PO TABS: 1000.0000 mg | ORAL_TABLET | Freq: Three times a day (TID) | ORAL | 0 refills | Status: AC

## 2022-02-04 MED ORDER — AMISULPRIDE (ANTIEMETIC) 5 MG/2ML IV SOLN
10.0000 mg | Freq: Once | INTRAVENOUS | Status: AC
Start: 2022-02-04 — End: 2022-02-04
  Administered 2022-02-04: 10 mg via INTRAVENOUS

## 2022-02-04 MED ORDER — KETAMINE HCL 50 MG/5ML IJ SOSY
PREFILLED_SYRINGE | INTRAMUSCULAR | Status: AC
  Filled 2022-02-04: qty 5

## 2022-02-04 MED ORDER — ACETAMINOPHEN 500 MG PO TABS: 1000.0000 mg | ORAL_TABLET | Freq: Once | ORAL | Status: DC

## 2022-02-04 MED ORDER — SCOPOLAMINE 1 MG/3DAYS TD PT72
MEDICATED_PATCH | TRANSDERMAL | Status: AC
  Filled 2022-02-04: qty 1

## 2022-02-04 MED ORDER — LACTATED RINGERS IV SOLN: INTRAVENOUS | Status: DC

## 2022-02-04 MED ORDER — FENTANYL CITRATE (PF) 100 MCG/2ML IJ SOLN
25.0000 ug | INTRAMUSCULAR | Status: DC | PRN
  Administered 2022-02-04: 50 ug via INTRAVENOUS

## 2022-02-04 MED ORDER — GELATIN ABSORBABLE 100 EX MISC
CUTANEOUS | Status: DC | PRN
  Administered 2022-02-04: 1 via TOPICAL

## 2022-02-04 MED ORDER — ONDANSETRON HCL 4 MG PO TABS: 4.0000 mg | ORAL_TABLET | Freq: Three times a day (TID) | ORAL | 0 refills | Status: AC | PRN

## 2022-02-04 MED ORDER — PROPOFOL 10 MG/ML IV BOLUS
INTRAVENOUS | Status: DC | PRN
  Administered 2022-02-04: 200 mg via INTRAVENOUS
  Administered 2022-02-04: 100 mg via INTRAVENOUS

## 2022-02-04 MED ORDER — FENTANYL CITRATE (PF) 100 MCG/2ML IJ SOLN
INTRAMUSCULAR | Status: DC | PRN
  Administered 2022-02-04 (×5): 50 ug via INTRAVENOUS

## 2022-02-04 MED ORDER — MELOXICAM 15 MG PO TABS: 15.0000 mg | ORAL_TABLET | Freq: Every day | ORAL | 0 refills | Status: DC

## 2022-02-04 MED ORDER — OXYCODONE HCL 5 MG PO TABS
ORAL_TABLET | ORAL | Status: AC
  Filled 2022-02-04: qty 1

## 2022-02-04 SURGICAL SUPPLY — 118 items
ANCH SUT 2 SUTTK 12X2.4 STRL (Anchor) ×2 IMPLANT
ANCHOR SUTURETAK 2.4X12 BIOC # (Anchor) ×4 IMPLANT
APL PRP STRL LF DISP 70% ISPRP (MISCELLANEOUS) ×1
APL SKNCLS STERI-STRIP NONHPOA (GAUZE/BANDAGES/DRESSINGS) ×1
BENZOIN TINCTURE PRP APPL 2/3 (GAUZE/BANDAGES/DRESSINGS) ×2 IMPLANT
BIT DRILL 2.5 CANN STRL (BIT) ×1 IMPLANT
BIT DRILL 3.5 CANN STRL (BIT) ×1 IMPLANT
BLADE AVERAGE 25X9 (BLADE) ×2 IMPLANT
BLADE HEX COATED 2.75 (ELECTRODE) IMPLANT
BLADE SAW SGTL 14.8X61X.97 HD (BLADE) IMPLANT
BLADE SHAVER BONE 5.0X13 (MISCELLANEOUS) IMPLANT
BLADE SURG 10 STRL SS (BLADE) ×2 IMPLANT
BLADE SURG 15 STRL LF DISP TIS (BLADE) ×2 IMPLANT
BLADE SURG 15 STRL SS (BLADE) ×4
BNDG COHESIVE 4X5 TAN ST LF (GAUZE/BANDAGES/DRESSINGS) ×2 IMPLANT
BNDG ELASTIC 4X5.8 VLCR STR LF (GAUZE/BANDAGES/DRESSINGS) IMPLANT
BNDG ELASTIC 6X5.8 VLCR STR LF (GAUZE/BANDAGES/DRESSINGS) ×2 IMPLANT
BNDG GAUZE DERMACEA FLUFF (GAUZE/BANDAGES/DRESSINGS)
BNDG GAUZE DERMACEA FLUFF 4 (GAUZE/BANDAGES/DRESSINGS) IMPLANT
BNDG GZE DERMACEA 4 6PLY (GAUZE/BANDAGES/DRESSINGS)
BURR OVAL 8 FLU 4.0X13 (MISCELLANEOUS) IMPLANT
CANISTER SUCT 1200ML W/VALVE (MISCELLANEOUS) IMPLANT
CHLORAPREP W/TINT 26 (MISCELLANEOUS) ×2 IMPLANT
CLSR STERI-STRIP ANTIMIC 1/2X4 (GAUZE/BANDAGES/DRESSINGS) ×2 IMPLANT
COOLER ICEMAN CLASSIC (MISCELLANEOUS) ×2 IMPLANT
COUNTERSINK SURGICAL 3.5 4.0 (DRILL) ×2
COVER BACK TABLE 60X90IN (DRAPES) ×2 IMPLANT
CUFF TOURN SGL QUICK 34 (TOURNIQUET CUFF)
CUFF TRNQT CYL 34X4.125X (TOURNIQUET CUFF) IMPLANT
DISSECTOR 3.5MM X 13CM CVD (MISCELLANEOUS) ×2 IMPLANT
DISSECTOR 4.0MMX13CM CVD (MISCELLANEOUS) ×2 IMPLANT
DRAPE ARTHROSCOPY W/POUCH 90 (DRAPES) ×2 IMPLANT
DRAPE C-ARM 42X72 X-RAY (DRAPES) ×2 IMPLANT
DRAPE C-ARMOR (DRAPES) ×2 IMPLANT
DRAPE EXTREMITY T 121X128X90 (DISPOSABLE) ×2 IMPLANT
DRAPE IMP U-DRAPE 54X76 (DRAPES) ×2 IMPLANT
DRAPE INCISE IOBAN 66X45 STRL (DRAPES) IMPLANT
DRAPE TOP ARMCOVERS (MISCELLANEOUS) ×2 IMPLANT
DRAPE U-SHAPE 47X51 STRL (DRAPES) ×2 IMPLANT
DRAPE-T ARTHROSCOPY W/POUCH (DRAPES) ×2 IMPLANT
DRSG EMULSION OIL 3X3 NADH (GAUZE/BANDAGES/DRESSINGS) IMPLANT
DRSG MEPILEX BORDER 4X8 (GAUZE/BANDAGES/DRESSINGS) ×2 IMPLANT
DRSG PAD ABDOMINAL 8X10 ST (GAUZE/BANDAGES/DRESSINGS) IMPLANT
ELECT REM PT RETURN 9FT ADLT (ELECTROSURGICAL) ×2
ELECTRODE REM PT RTRN 9FT ADLT (ELECTROSURGICAL) ×1 IMPLANT
FIBER TAPE 2MM (SUTURE) IMPLANT
GAUZE SPONGE 4X4 12PLY STRL (GAUZE/BANDAGES/DRESSINGS) ×4 IMPLANT
GAUZE XEROFORM 1X8 LF (GAUZE/BANDAGES/DRESSINGS) IMPLANT
GEL BONE GRAFT DBM ALLOSYNC 5 (Bone Implant) ×1 IMPLANT
GLOVE BIO SURGEON STRL SZ 6.5 (GLOVE) ×2 IMPLANT
GLOVE BIOGEL PI IND STRL 6.5 (GLOVE) ×1 IMPLANT
GLOVE BIOGEL PI IND STRL 8 (GLOVE) ×1 IMPLANT
GLOVE BIOGEL PI INDICATOR 6.5 (GLOVE) ×1
GLOVE BIOGEL PI INDICATOR 8 (GLOVE) ×1
GLOVE ECLIPSE 8.0 STRL XLNG CF (GLOVE) ×2 IMPLANT
GOWN STRL REUS W/ TWL LRG LVL3 (GOWN DISPOSABLE) ×2 IMPLANT
GOWN STRL REUS W/ TWL XL LVL3 (GOWN DISPOSABLE) ×1 IMPLANT
GOWN STRL REUS W/TWL LRG LVL3 (GOWN DISPOSABLE) ×4
GOWN STRL REUS W/TWL XL LVL3 (GOWN DISPOSABLE) ×4 IMPLANT
GRAFT TISS SEMITEND 4-8 (Bone Implant) IMPLANT
GUIDEWIRE 1.6 (WIRE) ×4
GUIDEWIRE ORTH 157X1.6XTROC (WIRE) IMPLANT
IMMOBILIZER KNEE 22 UNIV (SOFTGOODS) IMPLANT
IMMOBILIZER KNEE 24 THIGH 36 (MISCELLANEOUS) IMPLANT
IMMOBILIZER KNEE 24 UNIV (MISCELLANEOUS)
KIT BIO-SUTURETAK 2.4 SPR TROC (KITS) ×3 IMPLANT
KIT TRANSTIBIAL (DISPOSABLE) ×3 IMPLANT
MANIFOLD NEPTUNE II (INSTRUMENTS) ×2 IMPLANT
NDL SUT 6 .5 CRC .975X.05 MAYO (NEEDLE) IMPLANT
NEEDLE MAYO TAPER (NEEDLE)
NS IRRIG 1000ML POUR BTL (IV SOLUTION) ×2 IMPLANT
PACK ARTHROSCOPY DSU (CUSTOM PROCEDURE TRAY) ×2 IMPLANT
PACK BASIN DAY SURGERY FS (CUSTOM PROCEDURE TRAY) ×2 IMPLANT
PAD CAST 4YDX4 CTTN HI CHSV (CAST SUPPLIES) IMPLANT
PAD COLD SHLDR WRAP-ON (PAD) ×2 IMPLANT
PADDING CAST COTTON 4X4 STRL (CAST SUPPLIES)
PADDING CAST COTTON 6X4 STRL (CAST SUPPLIES) IMPLANT
PENCIL SMOKE EVACUATOR (MISCELLANEOUS) ×2 IMPLANT
PORT APPOLLO RF 90DEGREE MULTI (SURGICAL WAND) IMPLANT
SCREW BONE 3.5X42MM CORTICAL (Screw) ×1 IMPLANT
SCREW CORT 3.5X40 LP ANKLE (Screw) ×1 IMPLANT
SCREW CORTICAL 3.5X78MM LP TM (Screw) ×1 IMPLANT
SCREW COUNTERSNK SRGCL 3.5 4.0 (DRILL) IMPLANT
SCREW PEEK INT. 7X30 (Screw) ×1 IMPLANT
SHEET MEDIUM DRAPE 40X70 STRL (DRAPES) ×2 IMPLANT
SLEEVE SCD COMPRESS KNEE MED (STOCKING) IMPLANT
SPIKE FLUID TRANSFER (MISCELLANEOUS) IMPLANT
SPONGE SURGIFOAM ABS GEL 12-7 (HEMOSTASIS) ×1 IMPLANT
SPONGE T-LAP 18X18 ~~LOC~~+RFID (SPONGE) ×2 IMPLANT
SPONGE T-LAP 4X18 ~~LOC~~+RFID (SPONGE) ×2 IMPLANT
SUCTION FRAZIER HANDLE 10FR (MISCELLANEOUS) ×1
SUCTION TUBE FRAZIER 10FR DISP (MISCELLANEOUS) ×1 IMPLANT
SUT FIBERWIRE #2 38 T-5 BLUE (SUTURE) ×4
SUT MNCRL AB 3-0 PS2 18 (SUTURE) IMPLANT
SUT MNCRL AB 4-0 PS2 18 (SUTURE) ×2 IMPLANT
SUT VIC AB 0 CT1 18XCR BRD 8 (SUTURE) IMPLANT
SUT VIC AB 0 CT1 27 (SUTURE) ×2
SUT VIC AB 0 CT1 27XBRD ANBCTR (SUTURE) ×1 IMPLANT
SUT VIC AB 0 CT1 8-18 (SUTURE)
SUT VIC AB 1 CT1 27 (SUTURE)
SUT VIC AB 1 CT1 27XBRD ANBCTR (SUTURE) IMPLANT
SUT VIC AB 2-0 SH 27 (SUTURE)
SUT VIC AB 2-0 SH 27XBRD (SUTURE) IMPLANT
SUT VIC AB 3-0 FS2 27 (SUTURE) IMPLANT
SUT VIC AB 3-0 SH 27 (SUTURE) ×2
SUT VIC AB 3-0 SH 27X BRD (SUTURE) ×1 IMPLANT
SUTURE FIBERWR #2 38 T-5 BLUE (SUTURE) ×2 IMPLANT
SUTURE TAPE 1.3 FIBERLOP 20 ST (SUTURE) ×1 IMPLANT
SUTURETAPE 1.3 FIBERLOOP 20 ST (SUTURE) ×2
SYR BULB EAR ULCER 3OZ GRN STR (SYRINGE) IMPLANT
TAPE CLOTH 3X10 TAN LF (GAUZE/BANDAGES/DRESSINGS) IMPLANT
TENDON SEMI-TENDINOSUS (Bone Implant) ×2 IMPLANT
TOWEL GREEN STERILE FF (TOWEL DISPOSABLE) ×6 IMPLANT
TUBE SUCTION HIGH CAP CLEAR NV (SUCTIONS) ×2 IMPLANT
TUBING ARTHROSCOPY IRRIG 16FT (MISCELLANEOUS) ×2 IMPLANT
WATER STERILE IRR 1000ML POUR (IV SOLUTION) ×2 IMPLANT
WRAP KNEE MAXI GEL POST OP (GAUZE/BANDAGES/DRESSINGS) IMPLANT
YANKAUER SUCT BULB TIP NO VENT (SUCTIONS) IMPLANT

## 2022-02-04 NOTE — Anesthesia Preprocedure Evaluation (Addendum)
Anesthesia Evaluation  Patient identified by MRN, date of birth, ID band Patient awake    Reviewed: Allergy & Precautions, NPO status , Patient's Chart, lab work & pertinent test results  History of Anesthesia Complications (+) PONV and history of anesthetic complications  Airway Mallampati: II  TM Distance: >3 FB Neck ROM: Full    Dental no notable dental hx. (+) Teeth Intact, Dental Advisory Given   Pulmonary neg pulmonary ROS,    Pulmonary exam normal breath sounds clear to auscultation       Cardiovascular negative cardio ROS Normal cardiovascular exam Rhythm:Regular Rate:Normal     Neuro/Psych PSYCHIATRIC DISORDERS Depression negative neurological ROS     GI/Hepatic negative GI ROS, Neg liver ROS,   Endo/Other  diabetes  Renal/GU negative Renal ROS  negative genitourinary   Musculoskeletal negative musculoskeletal ROS (+)   Abdominal   Peds  Hematology negative hematology ROS (+)   Anesthesia Other Findings   Reproductive/Obstetrics                           Anesthesia Physical Anesthesia Plan  ASA: 2  Anesthesia Plan: General   Post-op Pain Management: Tylenol PO (pre-op)* and Ketamine IV*   Induction: Intravenous  PONV Risk Score and Plan: 4 or greater and Ondansetron, Dexamethasone, Midazolam, Scopolamine patch - Pre-op and TIVA  Airway Management Planned: LMA  Additional Equipment:   Intra-op Plan:   Post-operative Plan: Extubation in OR  Informed Consent: I have reviewed the patients History and Physical, chart, labs and discussed the procedure including the risks, benefits and alternatives for the proposed anesthesia with the patient or authorized representative who has indicated his/her understanding and acceptance.     Dental advisory given  Plan Discussed with: CRNA  Anesthesia Plan Comments:        Anesthesia Quick Evaluation

## 2022-02-04 NOTE — Op Note (Signed)
Orthopaedic Surgery Operative Note (CSN: [id number])  Janet Mckee  02/22/1996 Date of Surgery: 02/04/2022   Diagnoses:  Right knee recurrent patellar instability, failed previous MPFL reconstruction  Procedure: Right MPFL reconstruction with allograft Right AMZ tibial tubercle transfer Right knee chondroplasty arthroscopic Right anterior compartment fasciotomy   Operative Finding Patient's joint surfaces in the medial lateral compartment as well as her meniscus were essentially pristine.  Her patellofemoral compartment had some grade 1 and 2 changes with some central softening of the trochlea.  There was about 5 degrees of hyperextension.  3 quadrants of lateral translation preoperatively.  She had a atypical incision about 5 cm in length off the medial side of the patella about 3 mm more medial than the patellar border.  This meant that we used 2 small semipercutaneous type incisions to perform a revision MPFL reconstruction.  Tibial tubercle transfer of about 9 mm was performed.  We removed the tubercle slightly distal about 7 mm.  Robust fixation overall of the patient's overall height meant that her tubercle was quite small.  If she fails this procedure she could potentially have a trochlear plasty is a reasonable option.  Successful completion of the planned procedure.    Post-operative plan: The patient will be nonweightbearing for 6 weeks following her standard proximal and distal realignment protocol.  The patient will be discharged home.  DVT prophylaxis Aspirin 81 mg twice daily for 6 weeks.  Pain control with PRN pain medication preferring oral medicines.  Follow up plan will be scheduled in approximately 7 days for incision check and XR.  Post-Op Diagnosis: Same Surgeons:Primary: Bjorn Pippin, MD Assistants:Caroline McBane PA-C Location: [facility] OR ROOM 1 Anesthesia: General with local Antibiotics: Ancef 2 g with local vancomycin powder 1 g at the surgical site Tourniquet  time:  Total Tourniquet Time Documented: Thigh (Right) - 64 minutes Total: Thigh (Right) - 64 minutes  Estimated Blood Loss: Minimal Complications: None Specimens: None Implants: Implant Name Type Inv. Item Serial No. Manufacturer Lot No. LRB No. Used Action  TENDON SEMI-TENDINOSUS - [id number] Bone Implant TENDON SEMI-TENDINOSUS [id number] [facility] [id number] Right 1 Implanted  GEL BONE GRAFT DBM ALLOSYNC 5 - [id number] Bone Implant GEL BONE GRAFT DBM ALLOSYNC 5 [id number] [employer] [id number] Right 1 Implanted  [facility] SUTURETAK 2.4X12 [facility] # - [id number] Anchor ANCHOR SUTURETAK 2.4X12 BIOC #  [employer] [id number] Right 1 Implanted  SCREW PEEK INT. 7X30 - [id number] Screw SCREW PEEK INT. 7X30  [employer] [id number] Right 1 Implanted  ANCHOR SUTURETAK 2.4X12 [facility] # - [id number] Anchor ANCHOR SUTURETAK 2.4X12 BIOC #  [employer] [id number] Right 1 Implanted  SCREW CORTICAL 3.5X78MM LP TM - [id number] Screw SCREW CORTICAL 3.5X78MM LP TM  [employer]  Right 1 Implanted  SCREW CORT 3.5X40 LP ANKLE - [id number] Screw SCREW CORT 3.5X40 LP ANKLE  [employer]  Right 1 Implanted  SCREW BONE 3.5X42MM CORTICAL - [id number] Screw SCREW BONE 3.5X42MM CORTICAL  [employer]  Right 1 Implanted    Indications for Surgery:   Janet Mckee is a 26 y.o. female with continued recurrent patellar instability.  Benefits and risks of operative and nonoperative management were discussed prior to surgery with patient/guardian(s) and informed consent form was completed.  Specific risks including infection, need for additional surgery, nonunion, malunion, hardware pain, hardware failure and stiffness amongst others.   Procedure:   The patient was identified properly. Informed consent was obtained and the surgical site was marked. The patient was taken up to suite where general  anesthesia was induced. The patient was placed in the supine position with a post against the surgical leg and a nonsterile tourniquet applied. The  surgical leg was then prepped and draped usual sterile fashion.  A standard surgical timeout was performed.  2 standard anterior portals were made and diagnostic arthroscopy performed. Please note the findings as noted above.  Gentle chondroplasty performed of the patellofemoral compartment.  This was done with a shaver back to a stable base.  We turned our attention to the tibial tubercle osteotomy.  We marked the proximal and distal aspect of the tubercle made a longitudinal incision about 6 cm in length just off the lateral border of the tibial tubercle.  We carried our incision down to the fascia and made full-thickness skin flaps.  The tubercle itself was skeletonized and we are able to visualize the medial and lateral aspect of the patellar tendon.  We performed a release of the anterior compartment using a scissor.  We then turned our attention to the lateral anterior compartment and we skeletonized the bone peeling off the proximal aspect of this compartment staying hard on bone to avoid damage to the muscle or the deep neurovascular structures.  There is a clear so that we are able to visualize the posterior aspect of the tibia from the lateral side.  A malleable retractor was used at that point protect posterior aspect of the tibia.  We turned our attention to the medial side and placed 2 K wires 0.062 in size and the alignment of our osteotomy.  This confirmed that osteotomy would not bridge the posterior cortex of the tibia.  Once this was performed we then used a 10 mm ACL type saw to perform our osteotomy from medial to lateral to allow for an anterior and medialized transfer of the tubercle.  We used osteotomes and saw to complete the osteotomy proximally and laterally as otherwise we would have exited the posterior aspect of the tibia due to the trajectory of our cut.  This was done as typical.  Once we are able to mobilize the fragment we had a large fragment that was able to be shifted  medially without detaching the periosteal hinge distally.  We held this in place provisionally with a K wire.  That point we used a curved osteotome to perform cortical stimulation of the medial side of the tibia that was underneath the osteotomy to prepare for bone grafting later.  The periosteum was elevated and  this was done underneath to assist and later closure the periosteum.    We then placed 3 - 3.5 mm small fragment screws made by [employer] were placed by technique to lag the osteotomy in place.  We used fluoroscopy to guide our position.  Were happy with our bite of all screws and each was countersunk to avoid hardware related pain.  A 10 mm translation was performed of the tubercle to avoid over medialization.  Gelfoam was placed at the osteotomy site to prevent bleeding and was removed once bleeding was controlled.  At that point [facility] putty was used to graft at the osteotomy site.  Attention was turned to the proximal medial patella where a proximal medial patellar skin incision was made and carried down through the skin and subcutaneous tissue.  We purposely felt that a small incision a few centimeters more lateral to the previous large incision would be appropriate and avoid deep dissection and trauma to the soft tissues.  We encountered multiple sutures were removed.  The medial border of the patella was exposed down to layer 3.  We tagged the superficial tissue which was consistent with the attenuated MPFL remnant.  The joint was not entered.  We then used 2 - 2.4 mm arthrex suturetak anchor placed at the proximal 25% and 50% marks of the patella from proximal to distal transversely.  These would be used to hold our graft in place using a luggage loop type suture pass.    Our graft was prepped in the form of a doubled over tibialis anterior graft that passed through a 6.13mm tunnel.   This was secured as above to the patella at its mid portion and the two loose tails were then passed under layer  2 to the medial epicondyle.  We then made a 3 cm approach starting at the medial epicondyle extending just proximal and posterior.  We took care to dissect the superficial tissues bluntly and used blunt retraction to ensure that the neurovascular structures were out of our field.   We identified the medial epicondyle.  Blunt dissection was performed below the fascia outside of the capsule from the medial patella to the adductor tubercle.    Using a Beath pin under fluoroscopy image intensification, the Beath pin was placed at [place] and placed from a posterior to anterior and distal to proximal direction exiting the lateral thigh.  Good position was noted on the fluoroscopic views.  The Beath pin and the adductor tubercle was over reamed with a 66mm cannulated reamer to the far lateral femoral cortex.  The sutures from the semitendinosus graft were then passed used the Beath pin exiting laterally.  With the knee in 30 degrees of flexion, the graft was appropriately tensioned to allow for appropriate medial lateral stability with approximately 42mm of lateral translation without being excessively tight.  Excellent tension was noted.  A guidepin was then placed in the femoral tunnel and the graft was secured using a 7x30-mm Arthrex peek screw with excellent purchase noted and the medial patellofemoral ligament graft appropriately tensioned.  There was adequate medial lateral stability, but the patella was not excessively tight.  The arthroscope was placed back in the joint to check position and translation of the patella before and after graft fixation noting it to be stable and articulating within the trochlea.  The native MPFL tissue was repaired at both its patellar and femoral origins in a pants over vest style fashion to imbricate this loose tissue with 0 Vicryl.  All incisions were irrigated copiously and vancomycin powder was placed prior to closure in a multilayer fashion with absorbable suture.   Sterile dressing and a knee immobilizer type brace were placed.  The patient was awoken from general anesthesia and taken to the PACU in stable condition without complication.     Incisions closed with absorbable suture. The patient was awoken from general anesthesia and taken to the PACU in stable condition without complication.   Alfonse Alpers, PA-C, present and scrubbed throughout the case, critical for completion in a timely fashion, and for retraction, instrumentation, closure.

## 2022-02-04 NOTE — Anesthesia Procedure Notes (Signed)
Procedure Name: LMA Insertion Date/Time: 02/04/2022 11:26 AM  Performed by: Thornell Mule, CRNAPre-anesthesia Checklist: Patient identified, Emergency Drugs available, Suction available and Patient being monitored Patient Re-evaluated:Patient Re-evaluated prior to induction Oxygen Delivery Method: Circle system utilized Preoxygenation: Pre-oxygenation with 100% oxygen Induction Type: IV induction LMA: LMA inserted LMA Size: 4.0 Number of attempts: 1 Placement Confirmation: positive ETCO2 Tube secured with: Tape Dental Injury: Teeth and Oropharynx as per pre-operative assessment

## 2022-02-04 NOTE — Transfer of Care (Signed)
Immediate Anesthesia Transfer of Care Note  Patient: Janet Mckee  Procedure(s) Performed: KNEE RECONSTRUCTION/MPPFL (Right: Knee) Guido Sander SLIDE (Right) FASCIOTOMY (Right) CHONDROPLASTY (Right)  Patient Location: PACU  Anesthesia Type:General  Level of Consciousness: drowsy, patient cooperative and responds to stimulation  Airway & Oxygen Therapy: Patient Spontanous Breathing and Patient connected to face mask oxygen  Post-op Assessment: Report given to RN and Post -op Vital signs reviewed and stable  Post vital signs: Reviewed and stable  Last Vitals:  Vitals Value Taken Time  BP 118/77 02/04/22 1258  Temp    Pulse 98 02/04/22 1259  Resp 20 02/04/22 1259  SpO2 100 % 02/04/22 1259  Vitals shown include unvalidated device data.  Last Pain:  Vitals:   02/04/22 0823  TempSrc: Oral  PainSc: 4          Complications: No notable events documented.

## 2022-02-04 NOTE — Anesthesia Postprocedure Evaluation (Signed)
Anesthesia Post Note  Patient: Janet Mckee  Procedure(s) Performed: KNEE RECONSTRUCTION/MPPFL (Right: Knee) Guido Sander SLIDE (Right: Knee) FASCIOTOMY (Right: Knee) CHONDROPLASTY (Right: Knee)     Patient location during evaluation: PACU Anesthesia Type: General Level of consciousness: awake and alert Pain management: pain level controlled Vital Signs Assessment: post-procedure vital signs reviewed and stable Respiratory status: spontaneous breathing, nonlabored ventilation, respiratory function stable and patient connected to nasal cannula oxygen Cardiovascular status: blood pressure returned to baseline and stable Postop Assessment: no apparent nausea or vomiting Anesthetic complications: no   No notable events documented.  Last Vitals:  Vitals:   02/04/22 1400 02/04/22 1415  BP: 116/76 120/79  Pulse: 83 82  Resp: 16 13  Temp:    SpO2: 96% 99%    Last Pain:  Vitals:   02/04/22 1415  TempSrc:   PainSc: Asleep                 Grazia Taffe L Elfrida Pixley

## 2022-02-04 NOTE — Interval H&P Note (Signed)
History and Physical Interval Note:  02/04/2022 8:17 AM  Janet Mckee  has presented today for surgery, with the diagnosis of RIGHT KNEE SUBLUXATION,COMPARTMENT SYNDROME, CHONDROMALACIA PATELLA.  The various methods of treatment have been discussed with the patient and family. After consideration of risks, benefits and other options for treatment, the patient has consented to  Procedure(s): KNEE RECONSTRUCTION/MPPFL (Right) FULKERSON SLIDE (Right) FASCIOTOMY (Right) CHONDROPLASTY (Right) as a surgical intervention.  The patient's history has been reviewed, patient examined, no change in status, stable for surgery.  I have reviewed the patient's chart and labs.  Questions were answered to the patient's satisfaction.     Bjorn Pippin

## 2022-02-05 ENCOUNTER — Encounter (HOSPITAL_BASED_OUTPATIENT_CLINIC_OR_DEPARTMENT_OTHER): Payer: Self-pay | Admitting: Orthopaedic Surgery

## 2022-02-24 ENCOUNTER — Ambulatory Visit: Payer: Medicaid Other

## 2022-03-02 ENCOUNTER — Other Ambulatory Visit: Payer: Self-pay

## 2022-03-02 ENCOUNTER — Ambulatory Visit: Payer: Medicaid Other | Attending: Orthopaedic Surgery

## 2022-03-02 DIAGNOSIS — G8929 Other chronic pain: Secondary | ICD-10-CM | POA: Diagnosis present

## 2022-03-02 DIAGNOSIS — M25561 Pain in right knee: Secondary | ICD-10-CM | POA: Insufficient documentation

## 2022-03-02 DIAGNOSIS — M6281 Muscle weakness (generalized): Secondary | ICD-10-CM | POA: Diagnosis present

## 2022-03-02 DIAGNOSIS — R262 Difficulty in walking, not elsewhere classified: Secondary | ICD-10-CM | POA: Insufficient documentation

## 2022-03-02 NOTE — Therapy (Signed)
OUTPATIENT PHYSICAL THERAPY LOWER EXTREMITY EVALUATION   Patient Name: Janet Mckee MRN: [id number] DOB:09-11-1995, 26 y.o., female Today's Date: 03/02/2022   PT End of Session - 03/02/22 1701     Visit Number 1    Number of Visits 17    Date for PT Re-Evaluation 05/04/22    Authorization Type [employer]    Authorization Time Period Pending auth    PT Start Time 1615    PT Stop Time 1702    PT Time Calculation (min) 47 min    Activity Tolerance Patient tolerated treatment well    Behavior During Therapy WFL for tasks assessed/performed             Past Medical History:  Diagnosis Date   Depression    PP with 2nd child, ok child   Gestational diabetes    Infection    UTI   Ovarian cyst    PONV (postoperative nausea and vomiting)    Past Surgical History:  Procedure Laterality Date   CESAREAN SECTION Bilateral 11/26/2019   Procedure: Cesarean section ;  Surgeon: Essie Hart, MD;  Location: [facility];  Service: Obstetrics;  Laterality: Bilateral;   CHONDROPLASTY Right 02/04/2022   Procedure: CHONDROPLASTY;  Surgeon: Bjorn Pippin, MD;  Location: [facility];  Service: Orthopedics;  Laterality: Right;   FASCIOTOMY Right 02/04/2022   Procedure: FASCIOTOMY;  Surgeon: Bjorn Pippin, MD;  Location: [facility];  Service: Orthopedics;  Laterality: Right;   Guido Sander SLIDE Right 02/04/2022   Procedure: Conan Bowens;  Surgeon: Bjorn Pippin, MD;  Location: [facility];  Service: Orthopedics;  Laterality: Right;   KNEE RECONSTRUCTION Right 02/04/2022   Procedure: KNEE RECONSTRUCTION/MPPFL;  Surgeon: Bjorn Pippin, MD;  Location: [facility];  Service: Orthopedics;  Laterality: Right;   KNEE SURGERY Bilateral    patellar realignment   TONSILLECTOMY     Patient Active Problem List   Diagnosis Date Noted   Abdominal pain postpartum 12/04/2019   Abdominal pain during pregnancy, unspecified trimester 12/04/2019    Encounter for maternal care for low transverse scar from previous cesarean delivery 11/26/2019   Delivery by elective cesarean section 11/26/2019   Status post primary low transverse cesarean section 11/26/2019   Abnormal glucose tolerance test (GTT) during pregnancy, antepartum 10/24/2019    PCP: No PCP  REFERRING PROVIDER: Ramond Marrow, MD  REFERRING DIAG: right knee arthroscopy with anterior medialization of the tubercle ,anterior compartment release and medial patellofemoral ligament reconstriction 02/04/2022  THERAPY DIAG:  Chronic pain of right knee  Muscle weakness (generalized)  Difficulty in walking, not elsewhere classified  Rationale for Evaluation and Treatment Rehabilitation  ONSET DATE: 02/04/2022  SUBJECTIVE:   SUBJECTIVE STATEMENT: Pt reports primary c/o Rt knee pain s/p Rt knee arthroscopy with anterior medialization of the tubercle ,anterior compartment release and medial patellofemoral ligament reconstriction on 02/04/2022 with Dr. Ramond Marrow. 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 She presents with her post-op protocol, which is detailed below. Will update for phase II in two weeks. Current pain is 5/10. Worst pain is 8/10. Best pain is 5/10. Aggravating factors include knee movement, walking. Easing factors include rest and pain medication.  PERTINENT HISTORY: right knee arthroscopy with anterior medialization of the tubercle ,anterior compartment release and medial patellofemoral ligament reconstriction 02/04/2022, previous BIL patellar realignment 14 years ago  PAIN:  Are you having pain? Yes: NPRS  scale: 5/10 Pain location: Rt knee Pain description: Achy Aggravating factors: knee movement, walking Relieving factors: rest and pain medication  PRECAUTIONS: Other: Weeks 0-6: TDWB with crutches, hinged brace unlocked for all activities except sleeping, avoid active knee extension, immediate  PROM to tolerance (goal 90 degrees by week 6), gentle quad sets, co-contraction, isometric quad/ hamstring in extension and at knee flexion >60 degrees. SLR with brace locked in extension.       Week 6-12: See evaluating PT for post-op protocol  WEIGHT BEARING RESTRICTIONS Yes TDWB through Rt  FALLS:  Has patient fallen in last 6 months? Yes. Number of falls 3, prior to surgery  LIVING ENVIRONMENT: Lives with: lives with their family Lives in: House/apartment Stairs: Yes: External: 3 steps; on right going up, on left going up, and can reach both Has following equipment at home: Crutches  OCCUPATION: Unemployed  PLOF: Independent  PATIENT GOALS Household ADLs, exercise  Screening for Suicide  Answer the following questions with Yes or No and place an "x" beside the action taken.  1. Over the past two weeks, have you felt down, depressed, or hopeless?   No  2. Within the past two weeks, have you felt little interest or pleasure in life?  No  If YES to either #1 or #2, then ask #3  3. Have you had thoughts that that life is not worth living or that you might be       better off dead?     If answer is NO and suspicion is low, then end   4. Over this past week, have you had any thoughts about hurting or even killing yourself?    If NO, then end. Patient in no immediate danger   5. If so, do you believe that you intend to or will harm yourself?       If NO, then end. Patient in no immediate danger   6.  Do you have a plan as to how you would hurt yourself?     7.  Over this past week, have you actually done anything to hurt yourself?    IF YES answers to either #4, #5, #6 or #7, then patient is AT RISK for suicide   Actions Taken  __X__  Screening negative; no further action required  ____  Screening positive; no immediate danger and patient already in treatment with a  mental health provider. Advise patient to speak to their mental health provider.  ____   Screening positive; no immediate danger. Patient advised to contact a mental  health provider for further assessment.   ____  Screening positive; in immediate danger as patient states intention of killing self,  has plan and a sense of imminence. Do not leave alone. Seek permission from  patient to contact a family member to inform them. Direct patient to go to ED.   OBJECTIVE:   DIAGNOSTIC FINDINGS: 02/18/2022: X-ray 2 Views Rt knee: Appropriate position of the tibial tubercle osteotomy. Hardware is in place without any hardware complicating features.   PATIENT SURVEYS:  LEFS 10/80  COGNITION:  Overall cognitive status: Within functional limits for tasks assessed     SENSATION: Not tested  EDEMA:  Circumferential: Rt: 40.5cm, Lt: 39.5cm   POSTURE: No Significant postural limitations  PALPATION: TTP about Rt tibial tubercle and medial joint line about surgical incisions  LOWER EXTREMITY ROM:  A/PROM Right eval Left eval  Knee flexion 57p!/ 67p! 130/135  Knee extension -5/0 5/8   (Blank rows =  not tested)  LOWER EXTREMITY MMT:  MMT Right eval Left eval  Hip flexion 3+/5 3+/5  Hip extension 3/5 2+/5  Hip abduction 4/5 3+/5  Knee flexion Not tested per post-op protocol 5/5  Knee extension Not tested per post-op protocol 5/5  Ankle dorsiflexion 5/5 5/5  Ankle plantarflexion 5/5 5/5   (Blank rows = not tested)   FUNCTIONAL TESTS:  5xSTS: 14 seconds with UE support   GAIT: Distance walked: 20 ft Assistive device utilized: Crutches Level of assistance: Modified independence Comments: Pt ambulates TDMW on Rt with crutches    TODAY'S TREATMENT: 03/02/2022: Demonstrated and issued HEP   PATIENT EDUCATION:  Education details: Pt educated on post-op protocols, prognosis, POC, LEFS, and HEP Person educated: Patient Education method: Explanation, Demonstration, and Handouts Education comprehension: verbalized understanding and returned demonstration   HOME  EXERCISE PROGRAM: Access Code: [email or web address] URL: [email or web address] Date: 03/02/2022 Prepared by: Carmelina Dane  Exercises - Long Sitting Quad Set  - 2 x daily - 7 x weekly - 2 sets - 10 reps - 20-sec hold - Heel slide with strap, LAYING ON BACK  - 2 x daily - 7 x weekly - 3 sets - 10 reps - 5-sec hold - Supine Isometric Hamstring Set  - 2 x daily - 7 x weekly - 2 sets - 10 reps - 20-sec hold - Supine Gluteal Sets  - 2 x daily - 7 x weekly - 2 sets - 10 reps - 20-sec hold  ASSESSMENT:  CLINICAL IMPRESSION: Patient is a 26 y.o. F who was seen today for physical therapy evaluation and treatment for Rt knee pain s/p Rt knee arthroscopy with anterior medialization of the tubercle ,anterior compartment release and medial patellofemoral ligament reconstriction on 02/04/2022 with Dr. Ramond Marrow.  Upon assessment, the pt's primary impairments include global BIL hip weakness, limited and painful Rt knee flexion and extension AROM, limited Rt knee MMT, poor 5xSTS, and TTP about the pt's surgical incisions. She will benefit from skilled PT to address her primary impairments and return to her prior level of function with less limitation. Will advance to phase II of her post-op protocol in two weeks.   OBJECTIVE IMPAIRMENTS Abnormal gait, decreased balance, decreased endurance, decreased mobility, difficulty walking, decreased ROM, decreased strength, hypomobility, increased edema, impaired flexibility, improper body mechanics, postural dysfunction, and pain.   ACTIVITY LIMITATIONS carrying, lifting, bending, sitting, standing, squatting, sleeping, stairs, transfers, bed mobility, and locomotion level  PARTICIPATION LIMITATIONS: meal prep, cleaning, laundry, interpersonal relationship, driving, shopping, community activity, and yard work  PERSONAL FACTORS  N/A  are also affecting patient's functional outcome.   REHAB POTENTIAL: Good  CLINICAL DECISION MAKING:  Stable/uncomplicated  EVALUATION COMPLEXITY: Low   GOALS: Goals reviewed with patient? Yes  SHORT TERM GOALS: Target date: 03/30/2022  Pt will report understanding and adherence to initial HEP in order to promote independence in the management of primary impairments. Baseline: HEP provided at eval Goal status: INITIAL  2.  Pt will achieve 0-90 degrees of active knee ROM in order to progress in her post-op protocol. Baseline: -5-57 degrees Goal status: INITIAL  3.  Pt will achieve 10 SLR in brace locked in extension in order to progress in her post-op protocol. Baseline: Unable to perform Goal status: INITIAL    LONG TERM GOALS: Target date: 04/27/2022   Pt will achieve 0-125 degrees of Rt knee AROM in order to promote WNL gait. Baseline: -5-57 degrees Goal status: INITIAL  2.  Pt will achieve a LEFS score  of 40/80 or higher in order to demonstrate improved functional ability as it relates to her primary impairments. Baseline: 10/80 Goal status: INITIAL  3.  Pt will achieve global BIL LE strength of 4+/5 in order to progress her independent LE strengthening regimen with less limitation. Baseline: See MMT chart Goal status: INITIAL  4.  Pt will report ability to walk/ stand > 30 minutes without AD in order to grocery shop with less limitation. Baseline: pain with any amount of walking/ standing with TDWB with crutches Goal status: INITIAL  5.  Pt will achieve 5xSTS in 12 seconds or less without UE support in order to promote safe transfers. Baseline: 14 seconds with UE support Goal status: INITIAL    PLAN: PT FREQUENCY: 2x/week  PT DURATION: 8 weeks  PLANNED INTERVENTIONS: Therapeutic exercises, Therapeutic activity, Neuromuscular re-education, Balance training, Gait training, Patient/Family education, Joint mobilization, Stair training, DME instructions, Aquatic Therapy, Dry Needling, Electrical stimulation, Cryotherapy, Moist heat, Manual lymph drainage, Compression  bandaging, Taping, Vasopneumatic device, Biofeedback, Ionotophoresis 4mg /ml Dexamethasone, Manual therapy, and Re-evaluation  PLAN FOR NEXT SESSION: Progress early LE strengthening/ ROM in accordance with post-op protocol; advance to phase II on or after 03/16/2022  Check all possible CPT codes: 03/18/2022 - PT Re-evaluation, 97110- Therapeutic Exercise, [phone]- Neuro Re-education, [phone] - Gait Training, 97140 - Manual Therapy, 97530 - Therapeutic Activities, 97535 - Self Care, 97014 - Electrical stimulation (unattended), 50539 - Electrical stimulation (Manual), [id number] - Iontophoresis, [id number] - Ultrasound, [id number] - Vaso, and [id number] - Aquatic therapy     If treatment provided at initial evaluation, no treatment charged due to lack of authorization.       [id number], PT, DPT 03/02/22 5:24 PM

## 2022-03-10 ENCOUNTER — Ambulatory Visit: Payer: Medicaid Other

## 2022-03-10 ENCOUNTER — Telehealth: Payer: Self-pay

## 2022-03-10 NOTE — Telephone Encounter (Signed)
PT called and left voicemail regarding missed visit.   Explained attendance policy and left reminder of next visit.  Eloy End   03/10/22 6:09 PM

## 2022-03-12 ENCOUNTER — Ambulatory Visit: Payer: Medicaid Other

## 2022-03-12 DIAGNOSIS — M6281 Muscle weakness (generalized): Secondary | ICD-10-CM

## 2022-03-12 DIAGNOSIS — M25561 Pain in right knee: Secondary | ICD-10-CM | POA: Diagnosis not present

## 2022-03-12 DIAGNOSIS — R262 Difficulty in walking, not elsewhere classified: Secondary | ICD-10-CM

## 2022-03-12 DIAGNOSIS — G8929 Other chronic pain: Secondary | ICD-10-CM

## 2022-03-12 NOTE — Therapy (Signed)
OUTPATIENT PHYSICAL THERAPY TREATMENT NOTE   Patient Name: Janet Mckee MRN: [id number] DOB:06/16/1996, 26 y.o., female Today's Date: 03/12/2022  PCP: No PCP REFERRING PROVIDER: Ophelia Charter, MD  END OF SESSION:   PT End of Session - 03/12/22 1206     Visit Number 2    Number of Visits 17    Date for PT Re-Evaluation 05/04/22    Authorization Type [employer]    Authorization Time Period 03/03/2022-05/31/2022    Authorization - Visit Number 1    Authorization - Number of Visits 10    PT Start Time 1128    PT Stop Time 1220   10 minutes vasopneumatic treatment   PT Time Calculation (min) 52 min    Activity Tolerance Patient tolerated treatment well    Behavior During Therapy WFL for tasks assessed/performed             Past Medical History:  Diagnosis Date   Depression    PP with 2nd child, ok child   Gestational diabetes    Infection    UTI   Ovarian cyst    PONV (postoperative nausea and vomiting)    Past Surgical History:  Procedure Laterality Date   CESAREAN SECTION Bilateral 11/26/2019   Procedure: Cesarean section ;  Surgeon: Sanjuana Kava, MD;  Location: [facility];  Service: Obstetrics;  Laterality: Bilateral;   CHONDROPLASTY Right 02/04/2022   Procedure: CHONDROPLASTY;  Surgeon: Hiram Gash, MD;  Location: [place];  Service: Orthopedics;  Laterality: Right;   FASCIOTOMY Right 02/04/2022   Procedure: FASCIOTOMY;  Surgeon: Hiram Gash, MD;  Location: [place];  Service: Orthopedics;  Laterality: Right;   Cyndie Chime SLIDE Right 02/04/2022   Procedure: Elmarie Mainland;  Surgeon: Hiram Gash, MD;  Location: [place];  Service: Orthopedics;  Laterality: Right;   KNEE RECONSTRUCTION Right 02/04/2022   Procedure: KNEE RECONSTRUCTION/MPPFL;  Surgeon: Hiram Gash, MD;  Location: [place];  Service: Orthopedics;  Laterality: Right;   KNEE SURGERY Bilateral    patellar realignment    TONSILLECTOMY     Patient Active Problem List   Diagnosis Date Noted   Abdominal pain postpartum 12/04/2019   Abdominal pain during pregnancy, unspecified trimester 12/04/2019   Encounter for maternal care for low transverse scar from previous cesarean delivery 11/26/2019   Delivery by elective cesarean section 11/26/2019   Status post primary low transverse cesarean section 11/26/2019   Abnormal glucose tolerance test (GTT) during pregnancy, antepartum 10/24/2019    REFERRING DIAG: right knee arthroscopy with anterior medialization of the tubercle ,anterior compartment release and medial patellofemoral ligament reconstriction 02/04/2022  THERAPY DIAG:  Chronic pain of right knee  Muscle weakness (generalized)  Difficulty in walking, not elsewhere classified  Rationale for Evaluation and Treatment Rehabilitation  PERTINENT HISTORY: right knee arthroscopy with anterior medialization of the tubercle ,anterior compartment release and medial patellofemoral ligament reconstriction 02/04/2022, previous BIL patellar realignment 14 years ago  PRECAUTIONS:  Weeks 0-6: TDWB with crutches, hinged brace unlocked for all activities except sleeping, avoid active knee extension, immediate PROM to tolerance (goal 90 degrees by week 6), gentle quad sets, co-contraction, isometric quad/ hamstring in extension and at knee flexion >60 degrees. SLR with brace locked in extension.       Week 6-12: See evaluating PT for post-op protocol  Phase II (Weeks 6-12): Beginning week 6 gradually over 2-3 weeks progress to full. Plan typically to wean from two crutches to 1  crutchto crutch free period. Full WBAT after conmpletion 8-9 weeks Brace: Discontinue while sleeping, but used for all for all ambulation and weightbearing exercises. Once full weightbearing ok to unlock 0-30 degrees first week full WB, 0-90 degrees next week. Discontinue when patient has good quad control and no lag. ROM: PROM as tolerated with  gentle end range stretching. AROM and AAROM to tolerance without resistance. Exercises: Begin and advance SLR. Once full WB and no lag on SLR and no limp during gait begin and slowly advance closed chain quad/ core/ hamstring strengthening.   SUBJECTIVE: Pt reports no pain currently and also reports daily HEP adherence.   PAIN:  Are you having pain? Yes: NPRS scale: 0/10 Pain location: Rt knee Pain description: Achy Aggravating factors: knee movement, walking Relieving factors: rest and pain medication   OBJECTIVE: (objective measures completed at initial evaluation unless otherwise dated)   DIAGNOSTIC FINDINGS: 02/18/2022: X-ray 2 Views Rt knee: Appropriate position of the tibial tubercle osteotomy. Hardware is in place without any hardware complicating features.    PATIENT SURVEYS:  LEFS 10/80   COGNITION:           Overall cognitive status: Within functional limits for tasks assessed                          SENSATION: Not tested   EDEMA:  Circumferential: Rt: 40.5cm, Lt: 39.5cm     POSTURE: No Significant postural limitations   PALPATION: TTP about Rt tibial tubercle and medial joint line about surgical incisions   LOWER EXTREMITY ROM:   A/PROM Right eval Left eval Right 03/12/2022  Knee flexion 57p!/ 67p! 130/135 80p!/ 86 AAROM p!/ 92 PROM following MET  Knee extension -5/0 5/8 -5/0   (Blank rows = not tested)   LOWER EXTREMITY MMT:   MMT Right eval Left eval  Hip flexion 3+/5 3+/5  Hip extension 3/5 2+/5  Hip abduction 4/5 3+/5  Knee flexion Not tested per post-op protocol 5/5  Knee extension Not tested per post-op protocol 5/5  Ankle dorsiflexion 5/5 5/5  Ankle plantarflexion 5/5 5/5   (Blank rows = not tested)     FUNCTIONAL TESTS:  5xSTS: 14 seconds with UE support     GAIT: Distance walked: 20 ft Assistive device utilized: Crutches Level of assistance: Modified independence Comments: Pt ambulates TDMW on Rt with crutches       TODAY'S  TREATMENT:  [facility] Adult PT Treatment:                                                DATE: 03/12/2022 Therapeutic Exercise: Supine Rt SLR with hip abduction with brace locked in extension 2x10  Supine Rt knee AAROM with green strap 2x10  Rt staggered bridge 3x10 with 3-sec hold Manual Therapy: Rt knee flexion contract/ relax with gentle overpressure at end range x10 with 5-sec hold at end range Neuromuscular re-ed: N/A Therapeutic Activity: N/A Modalities: Supine with Rt knee elevated, [employer] vasopneumatic treatment at 34d F x10 minutes Self Care: N/A     PATIENT EDUCATION:  Education details: Pt educated on post-op protocols, prognosis, POC, LEFS, and HEP Person educated: Patient Education method: Consulting civil engineer, Demonstration, and Handouts Education comprehension: verbalized understanding and returned demonstration     HOME EXERCISE PROGRAM: Access Code: [email or web address] URL: [email or web address] Date: 03/02/2022 Prepared by: Berline Lopes  Emelina Hinch   Exercises - Long Sitting Quad Set  - 2 x daily - 7 x weekly - 2 sets - 10 reps - 20-sec hold - Heel slide with strap, LAYING ON BACK  - 2 x daily - 7 x weekly - 3 sets - 10 reps - 5-sec hold - Supine Isometric Hamstring Set  - 2 x daily - 7 x weekly - 2 sets - 10 reps - 20-sec hold - Supine Gluteal Sets  - 2 x daily - 7 x weekly - 2 sets - 10 reps - 20-sec hold   ASSESSMENT:   CLINICAL IMPRESSION: Pt responded well to all interventions today, demonstrating good form and no increase in pain with suggested protocol exercises. She demonstrates improved ROM from eval, which was improved following AAROM and manual techniques. She will continue to benefit from skilled PT to address her primary impairments and return to her prior level of function with less limitation.     OBJECTIVE IMPAIRMENTS Abnormal gait, decreased balance, decreased endurance, decreased mobility, difficulty walking, decreased ROM, decreased strength,  hypomobility, increased edema, impaired flexibility, improper body mechanics, postural dysfunction, and pain.    ACTIVITY LIMITATIONS carrying, lifting, bending, sitting, standing, squatting, sleeping, stairs, transfers, bed mobility, and locomotion level   PARTICIPATION LIMITATIONS: meal prep, cleaning, laundry, interpersonal relationship, driving, shopping, community activity, and yard work   PERSONAL FACTORS  N/A  are also affecting patient's functional outcome.        GOALS: Goals reviewed with patient? Yes   SHORT TERM GOALS: Target date: 03/30/2022  Pt will report understanding and adherence to initial HEP in order to promote independence in the management of primary impairments. Baseline: HEP provided at eval 03/12/2022: Pt reports adherence to her HEP Goal status: ACHIEVED   2.  Pt will achieve 0-90 degrees of active knee ROM in order to progress in her post-op protocol. Baseline: -5-57 degrees 03/12/2022: -5-80 degrees AROM Goal status: IN PROGRESS   3.  Pt will achieve 10 SLR in brace locked in extension in order to progress in her post-op protocol. Baseline: Unable to perform 03/12/2022: Achieved Goal status: ACHIEVED       LONG TERM GOALS: Target date: 04/27/2022    Pt will achieve 0-125 degrees of Rt knee AROM in order to promote WNL gait. Baseline: -5-57 degrees Goal status: INITIAL   2.  Pt will achieve a LEFS score of 40/80 or higher in order to demonstrate improved functional ability as it relates to her primary impairments. Baseline: 10/80 Goal status: INITIAL   3.  Pt will achieve global BIL LE strength of 4+/5 in order to progress her independent LE strengthening regimen with less limitation. Baseline: See MMT chart Goal status: INITIAL   4.  Pt will report ability to walk/ stand > 30 minutes without AD in order to grocery shop with less limitation. Baseline: pain with any amount of walking/ standing with TDWB with crutches Goal status: INITIAL   5.  Pt  will achieve 5xSTS in 12 seconds or less without UE support in order to promote safe transfers. Baseline: 14 seconds with UE support Goal status: INITIAL       PLAN: PT FREQUENCY: 2x/week   PT DURATION: 8 weeks   PLANNED INTERVENTIONS: Therapeutic exercises, Therapeutic activity, Neuromuscular re-education, Balance training, Gait training, Patient/Family education, Joint mobilization, Stair training, DME instructions, Aquatic Therapy, Dry Needling, Electrical stimulation, Cryotherapy, Moist heat, Manual lymph drainage, Compression bandaging, Taping, Vasopneumatic device, Biofeedback, Ionotophoresis 32m/ml Dexamethasone, Manual therapy, and Re-evaluation  PLAN FOR NEXT SESSION: Progress early LE strengthening/ ROM in accordance with post-op protocol; advance to phase II on or after 03/16/2022    Vanessa Liberty, PT, DPT 03/12/22 12:25 PM

## 2022-03-16 ENCOUNTER — Encounter: Payer: Self-pay | Admitting: Physical Therapy

## 2022-03-16 ENCOUNTER — Ambulatory Visit: Payer: Medicaid Other | Admitting: Physical Therapy

## 2022-03-16 DIAGNOSIS — G8929 Other chronic pain: Secondary | ICD-10-CM

## 2022-03-16 DIAGNOSIS — R262 Difficulty in walking, not elsewhere classified: Secondary | ICD-10-CM

## 2022-03-16 DIAGNOSIS — M6281 Muscle weakness (generalized): Secondary | ICD-10-CM

## 2022-03-16 DIAGNOSIS — M25561 Pain in right knee: Secondary | ICD-10-CM | POA: Diagnosis not present

## 2022-03-16 NOTE — Therapy (Signed)
OUTPATIENT PHYSICAL THERAPY TREATMENT NOTE   Patient Name: Janet Mckee MRN: [id number] DOB:September 24, 1995, 26 y.o., female Today's Date: 03/16/2022  PCP: No PCP REFERRING PROVIDER: Ophelia Charter, MD  END OF SESSION:   PT End of Session - 03/16/22 1611     Visit Number 3    Number of Visits 17    Date for PT Re-Evaluation 05/04/22    Authorization Type [employer]    Authorization Time Period 03/03/2022-05/31/2022    Authorization - Visit Number 3    Authorization - Number of Visits 10    PT Start Time 4650    PT Stop Time 1655    PT Time Calculation (min) 40 min    Activity Tolerance Patient tolerated treatment well    Behavior During Therapy WFL for tasks assessed/performed             Past Medical History:  Diagnosis Date   Depression    PP with 2nd child, ok child   Gestational diabetes    Infection    UTI   Ovarian cyst    PONV (postoperative nausea and vomiting)    Past Surgical History:  Procedure Laterality Date   CESAREAN SECTION Bilateral 11/26/2019   Procedure: Cesarean section ;  Surgeon: Sanjuana Kava, MD;  Location: [facility];  Service: Obstetrics;  Laterality: Bilateral;   CHONDROPLASTY Right 02/04/2022   Procedure: CHONDROPLASTY;  Surgeon: Hiram Gash, MD;  Location: [place];  Service: Orthopedics;  Laterality: Right;   FASCIOTOMY Right 02/04/2022   Procedure: FASCIOTOMY;  Surgeon: Hiram Gash, MD;  Location: [place];  Service: Orthopedics;  Laterality: Right;   Cyndie Chime SLIDE Right 02/04/2022   Procedure: Elmarie Mainland;  Surgeon: Hiram Gash, MD;  Location: [place];  Service: Orthopedics;  Laterality: Right;   KNEE RECONSTRUCTION Right 02/04/2022   Procedure: KNEE RECONSTRUCTION/MPPFL;  Surgeon: Hiram Gash, MD;  Location: [place];  Service: Orthopedics;  Laterality: Right;   KNEE SURGERY Bilateral    patellar realignment   TONSILLECTOMY     Patient Active Problem List    Diagnosis Date Noted   Abdominal pain postpartum 12/04/2019   Abdominal pain during pregnancy, unspecified trimester 12/04/2019   Encounter for maternal care for low transverse scar from previous cesarean delivery 11/26/2019   Delivery by elective cesarean section 11/26/2019   Status post primary low transverse cesarean section 11/26/2019   Abnormal glucose tolerance test (GTT) during pregnancy, antepartum 10/24/2019    REFERRING DIAG: right knee arthroscopy with anterior medialization of the tubercle ,anterior compartment release and medial patellofemoral ligament reconstriction 02/04/2022  THERAPY DIAG:  Chronic pain of right knee  Muscle weakness (generalized)  Difficulty in walking, not elsewhere classified  Rationale for Evaluation and Treatment Rehabilitation  PERTINENT HISTORY: right knee arthroscopy with anterior medialization of the tubercle ,anterior compartment release and medial patellofemoral ligament reconstriction 02/04/2022, previous BIL patellar realignment 14 years ago  PRECAUTIONS:  Weeks 0-6: TDWB with crutches, hinged brace unlocked for all activities except sleeping, avoid active knee extension, immediate PROM to tolerance (goal 90 degrees by week 6), gentle quad sets, co-contraction, isometric quad/ hamstring in extension and at knee flexion >60 degrees. SLR with brace locked in extension.       Week 6-12: See evaluating PT for post-op protocol  Phase II (Weeks 6-12): Beginning week 6 gradually over 2-3 weeks progress to full. Plan typically to wean from two crutches to 1 crutchto crutch free period. Full  WBAT after conmpletion 8-9 weeks Brace: Discontinue while sleeping, but used for all for all ambulation and weightbearing exercises. Once full weightbearing ok to unlock 0-30 degrees first week full WB, 0-90 degrees next week. Discontinue when patient has good quad control and no lag. ROM: PROM as tolerated with gentle end range stretching. AROM and AAROM to  tolerance without resistance. Exercises: Begin and advance SLR. Once full WB and no lag on SLR and no limp during gait begin and slowly advance closed chain quad/ core/ hamstring strengthening.   SUBJECTIVE: Pt reports she had a slip on water with no fall and caught her self on the wall, but this has increased her R knee pain.  PAIN:  Are you having pain? Yes: NPRS scale: 5/10 Pain location: Rt knee Pain description: Achy Aggravating factors: knee movement, walking Relieving factors: rest and pain medication   OBJECTIVE: (objective measures completed at initial evaluation unless otherwise dated)   DIAGNOSTIC FINDINGS: 02/18/2022: X-ray 2 Views Rt knee: Appropriate position of the tibial tubercle osteotomy. Hardware is in place without any hardware complicating features.    PATIENT SURVEYS:  LEFS 10/80   COGNITION:           Overall cognitive status: Within functional limits for tasks assessed                          SENSATION: Not tested   EDEMA:  Circumferential: Rt: 40.5cm, Lt: 39.5cm     POSTURE: No Significant postural limitations   PALPATION: TTP about Rt tibial tubercle and medial joint line about surgical incisions   LOWER EXTREMITY ROM:   A/PROM Right eval Left eval Right 03/12/2022  Knee flexion 57p!/ 67p! 130/135 80p!/ 86 AAROM p!/ 92 PROM following MET  Knee extension -5/0 5/8 -5/0   (Blank rows = not tested)   LOWER EXTREMITY MMT:   MMT Right eval Left eval  Hip flexion 3+/5 3+/5  Hip extension 3/5 2+/5  Hip abduction 4/5 3+/5  Knee flexion Not tested per post-op protocol 5/5  Knee extension Not tested per post-op protocol 5/5  Ankle dorsiflexion 5/5 5/5  Ankle plantarflexion 5/5 5/5   (Blank rows = not tested)     FUNCTIONAL TESTS:  5xSTS: 14 seconds with UE support     GAIT: Distance walked: 20 ft Assistive device utilized: Crutches Level of assistance: Modified independence Comments: Pt ambulates TDMW on Rt with crutches        TODAY'S TREATMENT:  [facility] Adult PT Treatment:                                                DATE: 03/16/2022 Therapeutic Exercise: Supine Rt SLR with hip abduction with brace locked in extension 3x10  Supine Rt knee AAROM with green strap 3x10  Isometric quad sets in ext Isometric HS sets 10'' x10 Isometric knee ext at >60 degrees of flexion 2x10 (sub max) Isometric knee flexion at >60 degrees 2x10 AAROM knee flexion with stretch Manual Therapy: Manual knee flexion  Modalities: Supine with Rt knee elevated, Game Ready vasopneumatic treatment at 34d F x10 minutes   [facility] Adult PT Treatment:  DATE: 03/12/2022 Therapeutic Exercise: Supine Rt SLR with hip abduction with brace locked in extension 2x10  Supine Rt knee AAROM with green strap 2x10  Rt staggered bridge 3x10 with 3-sec hold Manual Therapy: Rt knee flexion contract/ relax with gentle overpressure at end range x10 with 5-sec hold at end range Neuromuscular re-ed: N/A Therapeutic Activity: N/A Modalities: Supine with Rt knee elevated, Game Ready vasopneumatic treatment at 34d F x10 minutes Self Care: N/A     HOME EXERCISE PROGRAM: Access Code: [email or web address] URL: [email or web address] Date: 03/02/2022 Prepared by: Vanessa [place]   Exercises - Long Sitting Quad Set  - 2 x daily - 7 x weekly - 2 sets - 10 reps - 20-sec hold - Heel slide with strap, LAYING ON BACK  - 2 x daily - 7 x weekly - 3 sets - 10 reps - 5-sec hold - Supine Isometric Hamstring Set  - 2 x daily - 7 x weekly - 2 sets - 10 reps - 20-sec hold - Supine Gluteal Sets  - 2 x daily - 7 x weekly - 2 sets - 10 reps - 20-sec hold   ASSESSMENT:   CLINICAL IMPRESSION: Bralyn tolerated session well with minimal increase in discomfort.  She did have a near fall in which she tensed her R LE since last visit.  There was no direct trauma or fall.  Hopefully this has caused only a temporary increase in sxs.   She will let her MD know on follow up (Thursday) if she is still having increased pain; she confirms understanding.     OBJECTIVE IMPAIRMENTS Abnormal gait, decreased balance, decreased endurance, decreased mobility, difficulty walking, decreased ROM, decreased strength, hypomobility, increased edema, impaired flexibility, improper body mechanics, postural dysfunction, and pain.    ACTIVITY LIMITATIONS carrying, lifting, bending, sitting, standing, squatting, sleeping, stairs, transfers, bed mobility, and locomotion level   PARTICIPATION LIMITATIONS: meal prep, cleaning, laundry, interpersonal relationship, driving, shopping, community activity, and yard work   PERSONAL FACTORS  N/A  are also affecting patient's functional outcome.        GOALS: Goals reviewed with patient? Yes   SHORT TERM GOALS: Target date: 03/30/2022  Pt will report understanding and adherence to initial HEP in order to promote independence in the management of primary impairments. Baseline: HEP provided at eval 03/12/2022: Pt reports adherence to her HEP Goal status: ACHIEVED   2.  Pt will achieve 0-90 degrees of active knee ROM in order to progress in her post-op protocol. Baseline: -5-57 degrees 03/12/2022: -5-80 degrees AROM Goal status: IN PROGRESS   3.  Pt will achieve 10 SLR in brace locked in extension in order to progress in her post-op protocol. Baseline: Unable to perform 03/12/2022: Achieved Goal status: ACHIEVED       LONG TERM GOALS: Target date: 04/27/2022    Pt will achieve 0-125 degrees of Rt knee AROM in order to promote WNL gait. Baseline: -5-57 degrees Goal status: INITIAL   2.  Pt will achieve a LEFS score of 40/80 or higher in order to demonstrate improved functional ability as it relates to her primary impairments. Baseline: 10/80 Goal status: INITIAL   3.  Pt will achieve global BIL LE strength of 4+/5 in order to progress her independent LE strengthening regimen with less  limitation. Baseline: See MMT chart Goal status: INITIAL   4.  Pt will report ability to walk/ stand > 30 minutes without AD in order to grocery shop with less limitation. Baseline: pain with any amount of walking/  standing with TDWB with crutches Goal status: INITIAL   5.  Pt will achieve 5xSTS in 12 seconds or less without UE support in order to promote safe transfers. Baseline: 14 seconds with UE support Goal status: INITIAL       PLAN: PT FREQUENCY: 2x/week   PT DURATION: 8 weeks   PLANNED INTERVENTIONS: Therapeutic exercises, Therapeutic activity, Neuromuscular re-education, Balance training, Gait training, Patient/Family education, Joint mobilization, Stair training, DME instructions, Aquatic Therapy, Dry Needling, Electrical stimulation, Cryotherapy, Moist heat, Manual lymph drainage, Compression bandaging, Taping, Vasopneumatic device, Biofeedback, Ionotophoresis 55m/ml Dexamethasone, Manual therapy, and Re-evaluation   PLAN FOR NEXT SESSION: Progress early LE strengthening/ ROM in accordance with post-op protocol; advance to phase II on or after 03/16/2022    KKevan NyReinhartsen PT 03/16/22 4:49 PM

## 2022-03-18 ENCOUNTER — Ambulatory Visit: Payer: Medicaid Other | Admitting: Physical Therapy

## 2022-03-23 ENCOUNTER — Ambulatory Visit: Payer: Medicaid Other | Attending: Orthopaedic Surgery

## 2022-03-23 DIAGNOSIS — G8929 Other chronic pain: Secondary | ICD-10-CM | POA: Insufficient documentation

## 2022-03-23 DIAGNOSIS — M25561 Pain in right knee: Secondary | ICD-10-CM | POA: Insufficient documentation

## 2022-03-23 DIAGNOSIS — M6281 Muscle weakness (generalized): Secondary | ICD-10-CM | POA: Diagnosis present

## 2022-03-23 DIAGNOSIS — R262 Difficulty in walking, not elsewhere classified: Secondary | ICD-10-CM | POA: Insufficient documentation

## 2022-03-23 NOTE — Therapy (Signed)
OUTPATIENT PHYSICAL THERAPY TREATMENT NOTE   Patient Name: Janet Mckee MRN: [id number] DOB:1996/06/01, 26 y.o., female Today's Date: 03/23/2022  PCP: No PCP REFERRING PROVIDER: Ophelia Charter, MD  END OF SESSION:   PT End of Session - 03/23/22 1626     Visit Number 4    Number of Visits 17    Date for PT Re-Evaluation 05/04/22    Authorization Type [employer]    Authorization Time Period 03/03/2022-05/31/2022    Authorization - Visit Number 4    Authorization - Number of Visits 10    PT Start Time 1626    PT Stop Time 1707   10 minutes vasopneumatic treatment   PT Time Calculation (min) 41 min    Activity Tolerance Patient tolerated treatment well    Behavior During Therapy WFL for tasks assessed/performed              Past Medical History:  Diagnosis Date   Depression    PP with 2nd child, ok child   Gestational diabetes    Infection    UTI   Ovarian cyst    PONV (postoperative nausea and vomiting)    Past Surgical History:  Procedure Laterality Date   CESAREAN SECTION Bilateral 11/26/2019   Procedure: Cesarean section ;  Surgeon: Sanjuana Kava, MD;  Location: [facility];  Service: Obstetrics;  Laterality: Bilateral;   CHONDROPLASTY Right 02/04/2022   Procedure: CHONDROPLASTY;  Surgeon: Hiram Gash, MD;  Location: [place];  Service: Orthopedics;  Laterality: Right;   FASCIOTOMY Right 02/04/2022   Procedure: FASCIOTOMY;  Surgeon: Hiram Gash, MD;  Location: [place];  Service: Orthopedics;  Laterality: Right;   Cyndie Chime SLIDE Right 02/04/2022   Procedure: Elmarie Mainland;  Surgeon: Hiram Gash, MD;  Location: [place];  Service: Orthopedics;  Laterality: Right;   KNEE RECONSTRUCTION Right 02/04/2022   Procedure: KNEE RECONSTRUCTION/MPPFL;  Surgeon: Hiram Gash, MD;  Location: [place];  Service: Orthopedics;  Laterality: Right;   KNEE SURGERY Bilateral    patellar realignment    TONSILLECTOMY     Patient Active Problem List   Diagnosis Date Noted   Abdominal pain postpartum 12/04/2019   Abdominal pain during pregnancy, unspecified trimester 12/04/2019   Encounter for maternal care for low transverse scar from previous cesarean delivery 11/26/2019   Delivery by elective cesarean section 11/26/2019   Status post primary low transverse cesarean section 11/26/2019   Abnormal glucose tolerance test (GTT) during pregnancy, antepartum 10/24/2019    REFERRING DIAG: right knee arthroscopy with anterior medialization of the tubercle ,anterior compartment release and medial patellofemoral ligament reconstriction 02/04/2022  THERAPY DIAG:  Chronic pain of right knee  Muscle weakness (generalized)  Difficulty in walking, not elsewhere classified  Rationale for Evaluation and Treatment Rehabilitation  PERTINENT HISTORY: right knee arthroscopy with anterior medialization of the tubercle ,anterior compartment release and medial patellofemoral ligament reconstriction 02/04/2022, previous BIL patellar realignment 14 years ago  PRECAUTIONS:  Weeks 0-6: TDWB with crutches, hinged brace unlocked for all activities except sleeping, avoid active knee extension, immediate PROM to tolerance (goal 90 degrees by week 6), gentle quad sets, co-contraction, isometric quad/ hamstring in extension and at knee flexion >60 degrees. SLR with brace locked in extension.       Week 6-12: See evaluating PT for post-op protocol  Phase II (Weeks 6-12): Beginning week 6 gradually over 2-3 weeks progress to full. Plan typically to wean from two crutches to  1 crutchto crutch free period. Full WBAT after conmpletion 8-9 weeks Brace: Discontinue while sleeping, but used for all for all ambulation and weightbearing exercises. Once full weightbearing ok to unlock 0-30 degrees first week full WB, 0-90 degrees next week. Discontinue when patient has good quad control and no lag. ROM: PROM as tolerated with  gentle end range stretching. AROM and AAROM to tolerance without resistance. Exercises: Begin and advance SLR. Once full WB and no lag on SLR and no limp during gait begin and slowly advance closed chain quad/ core/ hamstring strengthening.   SUBJECTIVE: Pt reports her  PAIN:  Are you having pain? Yes: NPRS scale: 5/10 Pain location: Rt knee Pain description: Achy Aggravating factors: knee movement, walking Relieving factors: rest and pain medication   OBJECTIVE: (objective measures completed at initial evaluation unless otherwise dated)   DIAGNOSTIC FINDINGS: 02/18/2022: X-ray 2 Views Rt knee: Appropriate position of the tibial tubercle osteotomy. Hardware is in place without any hardware complicating features.    PATIENT SURVEYS:  LEFS 10/80   COGNITION:           Overall cognitive status: Within functional limits for tasks assessed                          SENSATION: Not tested   EDEMA:  Circumferential: Rt: 40.5cm, Lt: 39.5cm     POSTURE: No Significant postural limitations   PALPATION: TTP about Rt tibial tubercle and medial joint line about surgical incisions   LOWER EXTREMITY ROM:   A/PROM Right eval Left eval Right 03/12/2022 Right 03/23/2022  Knee flexion 57p!/ 67p! 130/135 80p!/ 86 AAROM p!/ 92 PROM following MET 90p!/ 97p! PROM  Knee extension -5/0 5/8 -5/0 0/ 2   (Blank rows = not tested)   LOWER EXTREMITY MMT:   MMT Right eval Left eval  Hip flexion 3+/5 3+/5  Hip extension 3/5 2+/5  Hip abduction 4/5 3+/5  Knee flexion Not tested per post-op protocol 5/5  Knee extension Not tested per post-op protocol 5/5  Ankle dorsiflexion 5/5 5/5  Ankle plantarflexion 5/5 5/5   (Blank rows = not tested)     FUNCTIONAL TESTS:  5xSTS: 14 seconds with UE support     GAIT: Distance walked: 20 ft Assistive device utilized: Crutches Level of assistance: Modified independence Comments: Pt ambulates TDMW on Rt with crutches       TODAY'S  TREATMENT:  [facility] Adult PT Treatment:                                                DATE: 03/23/2022 Therapeutic Exercise: Supine heel slides to end range 3x30 Supine SLR with abduction/ adduction 2x8 on Rt Staggered bridge with Rt leg forward 3x8 Seated LAQ 2x10 with 3-sec hold at end range Manual Therapy: Rt knee flexion PROM with end range hold 4x44mn Neuromuscular re-ed: N/A Therapeutic Activity: N/A Modalities: Supine with Rt knee elevated, Game Ready vasopneumatic treatment at 34d F x10 minutes Self Care: N/A  [facility]                                                [facility] Therapeutic Exercise: Supine Rt SLR with  hip abduction with brace locked in extension 3x10  Supine Rt knee AAROM with green strap 3x10  Isometric quad sets in ext Isometric HS sets 10'' x10 Isometric knee ext at >60 degrees of flexion 2x10 (sub max) Isometric knee flexion at >60 degrees 2x10 AAROM knee flexion with stretch Manual Therapy: Manual knee flexion  Modalities: Supine with Rt knee elevated, Game Ready vasopneumatic treatment at 34d F x10 minutes   [facility] Adult PT Treatment:                                                DATE: 03/12/2022 Therapeutic Exercise: Supine Rt SLR with hip abduction with brace locked in extension 2x10  Supine Rt knee AAROM with green strap 2x10  Rt staggered bridge 3x10 with 3-sec hold Manual Therapy: Rt knee flexion contract/ relax with gentle overpressure at end range x10 with 5-sec hold at end range Neuromuscular re-ed: N/A Therapeutic Activity: N/A Modalities: Supine with Rt knee elevated, Game Ready vasopneumatic treatment at 34d F x10 minutes Self Care: N/A     HOME EXERCISE PROGRAM: Access Code: [email or web address] URL: [email or web address] Date: 03/02/2022 Prepared by: Vanessa [place]   Exercises - Long Sitting Quad Set  - 2 x daily - 7 x weekly - 2 sets - 10 reps - 20-sec hold - Heel slide with strap, LAYING ON BACK   - 2 x daily - 7 x weekly - 3 sets - 10 reps - 5-sec hold - Supine Isometric Hamstring Set  - 2 x daily - 7 x weekly - 2 sets - 10 reps - 20-sec hold - Supine Gluteal Sets  - 2 x daily - 7 x weekly - 2 sets - 10 reps - 20-sec hold   ASSESSMENT:   CLINICAL IMPRESSION: Due to pt arriving 10 minutes late to her appointment, the session was truncated today. Pt responded well to all interventions today, demonstrating good form and mild increase in pain with progressed exercises. Pt's protocol was progressed to phase two, including SLR without brace, which she performed with roughly 10 degrees of quad lag. She will continue to benefit from skilled PT to address her primary impairments and return to her prior level of function with less limitation.     OBJECTIVE IMPAIRMENTS Abnormal gait, decreased balance, decreased endurance, decreased mobility, difficulty walking, decreased ROM, decreased strength, hypomobility, increased edema, impaired flexibility, improper body mechanics, postural dysfunction, and pain.    ACTIVITY LIMITATIONS carrying, lifting, bending, sitting, standing, squatting, sleeping, stairs, transfers, bed mobility, and locomotion level   PARTICIPATION LIMITATIONS: meal prep, cleaning, laundry, interpersonal relationship, driving, shopping, community activity, and yard work   PERSONAL FACTORS  N/A  are also affecting patient's functional outcome.        GOALS: Goals reviewed with patient? Yes   SHORT TERM GOALS: Target date: 03/30/2022  Pt will report understanding and adherence to initial HEP in order to promote independence in the management of primary impairments. Baseline: HEP provided at eval 03/12/2022: Pt reports adherence to her HEP Goal status: ACHIEVED   2.  Pt will achieve 0-90 degrees of active knee ROM in order to progress in her post-op protocol. Baseline: -5-57 degrees 03/12/2022: -5-80 degrees AROM Goal status: IN PROGRESS   3.  Pt will achieve 10 SLR in brace  locked in extension in order to progress in her post-op protocol. Baseline: Unable to perform  03/12/2022: Achieved Goal status: ACHIEVED       LONG TERM GOALS: Target date: 04/27/2022    Pt will achieve 0-125 degrees of Rt knee AROM in order to promote WNL gait. Baseline: -5-57 degrees Goal status: INITIAL   2.  Pt will achieve a LEFS score of 40/80 or higher in order to demonstrate improved functional ability as it relates to her primary impairments. Baseline: 10/80 Goal status: INITIAL   3.  Pt will achieve global BIL LE strength of 4+/5 in order to progress her independent LE strengthening regimen with less limitation. Baseline: See MMT chart Goal status: INITIAL   4.  Pt will report ability to walk/ stand > 30 minutes without AD in order to grocery shop with less limitation. Baseline: pain with any amount of walking/ standing with TDWB with crutches Goal status: INITIAL   5.  Pt will achieve 5xSTS in 12 seconds or less without UE support in order to promote safe transfers. Baseline: 14 seconds with UE support Goal status: INITIAL       PLAN: PT FREQUENCY: 2x/week   PT DURATION: 8 weeks   PLANNED INTERVENTIONS: Therapeutic exercises, Therapeutic activity, Neuromuscular re-education, Balance training, Gait training, Patient/Family education, Joint mobilization, Stair training, DME instructions, Aquatic Therapy, Dry Needling, Electrical stimulation, Cryotherapy, Moist heat, Manual lymph drainage, Compression bandaging, Taping, Vasopneumatic device, Biofeedback, Ionotophoresis 67m/ml Dexamethasone, Manual therapy, and Re-evaluation   PLAN FOR NEXT SESSION: Progress early LE strengthening/ ROM in accordance with post-op protocol; Advance to stage three at 3 months post-op    YVanessa [place] PT, DPT 03/23/22 5:32 PM

## 2022-03-25 ENCOUNTER — Ambulatory Visit: Payer: Medicaid Other

## 2022-03-25 DIAGNOSIS — M6281 Muscle weakness (generalized): Secondary | ICD-10-CM

## 2022-03-25 DIAGNOSIS — G8929 Other chronic pain: Secondary | ICD-10-CM

## 2022-03-25 DIAGNOSIS — R262 Difficulty in walking, not elsewhere classified: Secondary | ICD-10-CM

## 2022-03-25 DIAGNOSIS — M25561 Pain in right knee: Secondary | ICD-10-CM | POA: Diagnosis not present

## 2022-03-25 NOTE — Therapy (Signed)
OUTPATIENT PHYSICAL THERAPY TREATMENT NOTE   Patient Name: Janet Mckee MRN: [id number] DOB:12-15-1995, 26 y.o., female Today's Date: 03/25/2022  PCP: No PCP REFERRING PROVIDER: Ophelia Charter, MD  END OF SESSION:   PT End of Session - 03/25/22 1636     Visit Number 5    Number of Visits 17    Date for PT Re-Evaluation 05/04/22    Authorization Type [employer]    Authorization Time Period 03/03/2022-05/31/2022    Authorization - Visit Number 5    Authorization - Number of Visits 10    PT Start Time 1630   Pt arrived 15 minutes late to her appointment.   PT Stop Time 1658    PT Time Calculation (min) 28 min    Activity Tolerance Patient tolerated treatment well    Behavior During Therapy WFL for tasks assessed/performed               Past Medical History:  Diagnosis Date   Depression    PP with 2nd child, ok child   Gestational diabetes    Infection    UTI   Ovarian cyst    PONV (postoperative nausea and vomiting)    Past Surgical History:  Procedure Laterality Date   CESAREAN SECTION Bilateral 11/26/2019   Procedure: Cesarean section ;  Surgeon: Sanjuana Kava, MD;  Location: [place] [facility];  Service: Obstetrics;  Laterality: Bilateral;   CHONDROPLASTY Right 02/04/2022   Procedure: CHONDROPLASTY;  Surgeon: Hiram Gash, MD;  Location: [place];  Service: Orthopedics;  Laterality: Right;   FASCIOTOMY Right 02/04/2022   Procedure: FASCIOTOMY;  Surgeon: Hiram Gash, MD;  Location: [place];  Service: Orthopedics;  Laterality: Right;   Cyndie Chime SLIDE Right 02/04/2022   Procedure: Elmarie Mainland;  Surgeon: Hiram Gash, MD;  Location: [place];  Service: Orthopedics;  Laterality: Right;   KNEE RECONSTRUCTION Right 02/04/2022   Procedure: KNEE RECONSTRUCTION/MPPFL;  Surgeon: Hiram Gash, MD;  Location: [place];  Service: Orthopedics;  Laterality: Right;   KNEE SURGERY Bilateral    patellar realignment    TONSILLECTOMY     Patient Active Problem List   Diagnosis Date Noted   Abdominal pain postpartum 12/04/2019   Abdominal pain during pregnancy, unspecified trimester 12/04/2019   Encounter for maternal care for low transverse scar from previous cesarean delivery 11/26/2019   Delivery by elective cesarean section 11/26/2019   Status post primary low transverse cesarean section 11/26/2019   Abnormal glucose tolerance test (GTT) during pregnancy, antepartum 10/24/2019    REFERRING DIAG: right knee arthroscopy with anterior medialization of the tubercle ,anterior compartment release and medial patellofemoral ligament reconstriction 02/04/2022  THERAPY DIAG:  Chronic pain of right knee  Muscle weakness (generalized)  Difficulty in walking, not elsewhere classified  Rationale for Evaluation and Treatment Rehabilitation  PERTINENT HISTORY: right knee arthroscopy with anterior medialization of the tubercle ,anterior compartment release and medial patellofemoral ligament reconstriction 02/04/2022, previous BIL patellar realignment 14 years ago  PRECAUTIONS:  Weeks 0-6: TDWB with crutches, hinged brace unlocked for all activities except sleeping, avoid active knee extension, immediate PROM to tolerance (goal 90 degrees by week 6), gentle quad sets, co-contraction, isometric quad/ hamstring in extension and at knee flexion >60 degrees. SLR with brace locked in extension.       Week 6-12: See evaluating PT for post-op protocol  Phase II (Weeks 6-12): Beginning week 6 gradually over 2-3 weeks progress to full. Plan typically to  wean from two crutches to 1 crutchto crutch free period. Full WBAT after conmpletion 8-9 weeks Brace: Discontinue while sleeping, but used for all for all ambulation and weightbearing exercises. Once full weightbearing ok to unlock 0-30 degrees first week full WB, 0-90 degrees next week. Discontinue when patient has good quad control and no lag. ROM: PROM as tolerated with  gentle end range stretching. AROM and AAROM to tolerance without resistance. Exercises: Begin and advance SLR. Once full WB and no lag on SLR and no limp during gait begin and slowly advance closed chain quad/ core/ hamstring strengthening.   SUBJECTIVE: Pt reports her knee feels good today, reporting mild pain today. She reports adherence to her HEP  PAIN:  Are you having pain? Yes: NPRS scale: 4/10 Pain location: Rt knee Pain description: Achy Aggravating factors: knee movement, walking Relieving factors: rest and pain medication   OBJECTIVE: (objective measures completed at initial evaluation unless otherwise dated)   DIAGNOSTIC FINDINGS: 02/18/2022: X-ray 2 Views Rt knee: Appropriate position of the tibial tubercle osteotomy. Hardware is in place without any hardware complicating features.    PATIENT SURVEYS:  LEFS 10/80   COGNITION:           Overall cognitive status: Within functional limits for tasks assessed                          SENSATION: Not tested   EDEMA:  Circumferential: Rt: 40.5cm, Lt: 39.5cm     POSTURE: No Significant postural limitations   PALPATION: TTP about Rt tibial tubercle and medial joint line about surgical incisions   LOWER EXTREMITY ROM:   A/PROM Right eval Left eval Right 03/12/2022 Right 03/23/2022  Knee flexion 57p!/ 67p! 130/135 80p!/ 86 AAROM p!/ 92 PROM following MET 90p!/ 97p! PROM  Knee extension -5/0 5/8 -5/0 0/ 2   (Blank rows = not tested)   LOWER EXTREMITY MMT:   MMT Right eval Left eval  Hip flexion 3+/5 3+/5  Hip extension 3/5 2+/5  Hip abduction 4/5 3+/5  Knee flexion Not tested per post-op protocol 5/5  Knee extension Not tested per post-op protocol 5/5  Ankle dorsiflexion 5/5 5/5  Ankle plantarflexion 5/5 5/5   (Blank rows = not tested)     FUNCTIONAL TESTS:  5xSTS: 14 seconds with UE support     GAIT: Distance walked: 20 ft Assistive device utilized: Crutches Level of assistance: Modified  independence Comments: Pt ambulates TDMW on Rt with crutches       TODAY'S TREATMENT:  [facility] Adult PT Treatment:                                                DATE: 03/25/2022 Therapeutic Exercise: Side-to-side weight-shifting on Airex pad 2x10 BIL Heel/toe raises on Airex pad 2x10 BIL Step up/ retro step down on Airex pad 2x10 Mini squat with brace unlocked to 30d of flexion 3x10 Manual Therapy: N/A Neuromuscular re-ed: N/A Therapeutic Activity: N/A Modalities: N/A Self Care: N/A  [facility] Adult PT Treatment:                                                DATE: 03/23/2022 Therapeutic Exercise: Supine heel slides to  end range 3x30 Supine SLR with abduction/ adduction 2x8 on Rt Staggered bridge with Rt leg forward 3x8 Seated LAQ 2x10 with 3-sec hold at end range Manual Therapy: Rt knee flexion PROM with end range hold 4x42mn Neuromuscular re-ed: N/A Therapeutic Activity: N/A Modalities: Supine with Rt knee elevated, Game Ready vasopneumatic treatment at 34d F x10 minutes Self Care: N/A  [facility]                                                [facility]: 03[facility]16[facility]20[facility] Therapeutic Exercise: Supine Rt SLR with hip abduction with brace locked in extension 3x10  Supine Rt knee AAROM with green strap 3x10  Isometric quad sets in ext Isometric HS sets 10'' x10 Isometric knee ext at >60 degrees of flexion 2x10 (sub max) Isometric knee flexion at >60 degrees 2x10 AAROM knee flexion with stretch Manual Therapy: Manual knee flexion  Modalities: Supine with Rt knee elevated, Game Ready vasopneumatic treatment at 34d F x10 minutes       HOME EXERCISE PROGRAM: Access Code: [email or web address]: [email or web address] Date: 03/02/2022 Prepared by: TVanessa [place]  Exercises - Long Sitting Quad Set  - 2 x daily - 7 x weekly - 2 sets - 10 reps - 20-sec hold - Heel slide with strap, LAYING ON BACK  - 2 x daily - 7 x weekly - 3 sets - 10 reps - 5-sec hold - Supine  Isometric Hamstring Set  - 2 x daily - 7 x weekly - 2 sets - 10 reps - 20-sec hold - Supine Gluteal Sets  - 2 x daily - 7 x weekly - 2 sets - 10 reps - 20-sec hold   ASSESSMENT:   CLINICAL IMPRESSION: Due to pt arriving 15 minutes late, the session was truncated. She responded well to closed-chain exercises today and will continue to benefit from skilled PT to address her primary impairments and return to her prior level of function with less limitation.      OBJECTIVE IMPAIRMENTS Abnormal gait, decreased balance, decreased endurance, decreased mobility, difficulty walking, decreased ROM, decreased strength, hypomobility, increased edema, impaired flexibility, improper body mechanics, postural dysfunction, and pain.    ACTIVITY LIMITATIONS carrying, lifting, bending, sitting, standing, squatting, sleeping, stairs, transfers, bed mobility, and locomotion level   PARTICIPATION LIMITATIONS: meal prep, cleaning, laundry, interpersonal relationship, driving, shopping, community activity, and yard work   PERSONAL FACTORS  N/A  are also affecting patient's functional outcome.        GOALS: Goals reviewed with patient? Yes   SHORT TERM GOALS: Target date: 03/30/2022  Pt will report understanding and adherence to initial HEP in order to promote independence in the management of primary impairments. Baseline: HEP provided at eval 03/12/2022: Pt reports adherence to her HEP Goal status: ACHIEVED   2.  Pt will achieve 0-90 degrees of active knee ROM in order to progress in her post-op protocol. Baseline: -5-57 degrees 03/12/2022: -5-80 degrees AROM Goal status: IN PROGRESS   3.  Pt will achieve 10 SLR in brace locked in extension in order to progress in her post-op protocol. Baseline: Unable to perform 03/12/2022: Achieved Goal status: ACHIEVED       LONG TERM GOALS: Target date: 04/27/2022    Pt will achieve 0-125 degrees of Rt knee AROM in order to promote WNL gait. Baseline: -5-57  degrees Goal status: INITIAL  2.  Pt will achieve a LEFS score of 40/80 or higher in order to demonstrate improved functional ability as it relates to her primary impairments. Baseline: 10/80 Goal status: INITIAL   3.  Pt will achieve global BIL LE strength of 4+/5 in order to progress her independent LE strengthening regimen with less limitation. Baseline: See MMT chart Goal status: INITIAL   4.  Pt will report ability to walk/ stand > 30 minutes without AD in order to grocery shop with less limitation. Baseline: pain with any amount of walking/ standing with TDWB with crutches Goal status: INITIAL   5.  Pt will achieve 5xSTS in 12 seconds or less without UE support in order to promote safe transfers. Baseline: 14 seconds with UE support Goal status: INITIAL       PLAN: PT FREQUENCY: 2x/week   PT DURATION: 8 weeks   PLANNED INTERVENTIONS: Therapeutic exercises, Therapeutic activity, Neuromuscular re-education, Balance training, Gait training, Patient/Family education, Joint mobilization, Stair training, DME instructions, Aquatic Therapy, Dry Needling, Electrical stimulation, Cryotherapy, Moist heat, Manual lymph drainage, Compression bandaging, Taping, Vasopneumatic device, Biofeedback, Ionotophoresis 63m/ml Dexamethasone, Manual therapy, and Re-evaluation   PLAN FOR NEXT SESSION: Progress early LE strengthening/ ROM in accordance with post-op protocol; Advance to stage three at 3 months post-op    YVanessa Roberts PT, DPT 03/25/22 4:58 PM

## 2022-03-30 ENCOUNTER — Ambulatory Visit: Payer: Medicaid Other

## 2022-04-01 ENCOUNTER — Ambulatory Visit: Payer: Medicaid Other

## 2022-04-02 ENCOUNTER — Telehealth: Payer: Self-pay

## 2022-04-02 NOTE — Telephone Encounter (Signed)
Attempted to reach pt via provided phone number in regard to her 2nd no show. There was no answer and the pt's mailbox was full. Due to attendance policy, future appointments will be cancelled with exception of next remaining appointment.

## 2022-04-06 ENCOUNTER — Ambulatory Visit: Payer: Medicaid Other

## 2022-04-15 ENCOUNTER — Ambulatory Visit: Payer: Medicaid Other

## 2022-04-15 DIAGNOSIS — M25561 Pain in right knee: Secondary | ICD-10-CM | POA: Diagnosis not present

## 2022-04-15 DIAGNOSIS — G8929 Other chronic pain: Secondary | ICD-10-CM

## 2022-04-15 DIAGNOSIS — M6281 Muscle weakness (generalized): Secondary | ICD-10-CM

## 2022-04-15 DIAGNOSIS — R262 Difficulty in walking, not elsewhere classified: Secondary | ICD-10-CM

## 2022-04-15 NOTE — Therapy (Signed)
OUTPATIENT PHYSICAL THERAPY TREATMENT NOTE   Patient Name: Janet Mckee MRN: [id number] DOB:09-12-1995, 26 y.o., female Today's Date: 04/15/2022  PCP: No PCP REFERRING PROVIDER: Ophelia Charter, MD  END OF SESSION:   PT End of Session - 04/15/22 1035     Visit Number 6    Number of Visits 17    Date for PT Re-Evaluation 05/04/22    Authorization Type [employer]    Authorization Time Period 03/03/2022-05/31/2022    Authorization - Visit Number 6    Authorization - Number of Visits 10    PT Start Time 1010   Pt arrived 10 minutes late to her appointment.   PT Stop Time 1042    PT Time Calculation (min) 32 min    Activity Tolerance Patient tolerated treatment well    Behavior During Therapy WFL for tasks assessed/performed                Past Medical History:  Diagnosis Date   Depression    PP with 2nd child, ok child   Gestational diabetes    Infection    UTI   Ovarian cyst    PONV (postoperative nausea and vomiting)    Past Surgical History:  Procedure Laterality Date   CESAREAN SECTION Bilateral 11/26/2019   Procedure: Cesarean section ;  Surgeon: Sanjuana Kava, MD;  Location: [place] [facility];  Service: Obstetrics;  Laterality: Bilateral;   CHONDROPLASTY Right 02/04/2022   Procedure: CHONDROPLASTY;  Surgeon: Hiram Gash, MD;  Location: [place];  Service: Orthopedics;  Laterality: Right;   FASCIOTOMY Right 02/04/2022   Procedure: FASCIOTOMY;  Surgeon: Hiram Gash, MD;  Location: [place];  Service: Orthopedics;  Laterality: Right;   Cyndie Chime SLIDE Right 02/04/2022   Procedure: Elmarie Mainland;  Surgeon: Hiram Gash, MD;  Location: [place];  Service: Orthopedics;  Laterality: Right;   KNEE RECONSTRUCTION Right 02/04/2022   Procedure: KNEE RECONSTRUCTION/MPPFL;  Surgeon: Hiram Gash, MD;  Location: [place];  Service: Orthopedics;  Laterality: Right;   KNEE SURGERY Bilateral    patellar  realignment   TONSILLECTOMY     Patient Active Problem List   Diagnosis Date Noted   Abdominal pain postpartum 12/04/2019   Abdominal pain during pregnancy, unspecified trimester 12/04/2019   Encounter for maternal care for low transverse scar from previous cesarean delivery 11/26/2019   Delivery by elective cesarean section 11/26/2019   Status post primary low transverse cesarean section 11/26/2019   Abnormal glucose tolerance test (GTT) during pregnancy, antepartum 10/24/2019    REFERRING DIAG: right knee arthroscopy with anterior medialization of the tubercle ,anterior compartment release and medial patellofemoral ligament reconstriction 02/04/2022  THERAPY DIAG:  Chronic pain of right knee  Muscle weakness (generalized)  Difficulty in walking, not elsewhere classified  Rationale for Evaluation and Treatment Rehabilitation  PERTINENT HISTORY: right knee arthroscopy with anterior medialization of the tubercle ,anterior compartment release and medial patellofemoral ligament reconstriction 02/04/2022, previous BIL patellar realignment 14 years ago  PRECAUTIONS:  Weeks 0-6: TDWB with crutches, hinged brace unlocked for all activities except sleeping, avoid active knee extension, immediate PROM to tolerance (goal 90 degrees by week 6), gentle quad sets, co-contraction, isometric quad/ hamstring in extension and at knee flexion >60 degrees. SLR with brace locked in extension.       Week 6-12: See evaluating PT for post-op protocol  Phase II (Weeks 6-12): Beginning week 6 gradually over 2-3 weeks progress to full. Plan typically  to wean from two crutches to 1 crutchto crutch free period. Full WBAT after conmpletion 8-9 weeks Brace: Discontinue while sleeping, but used for all for all ambulation and weightbearing exercises. Once full weightbearing ok to unlock 0-30 degrees first week full WB, 0-90 degrees next week. Discontinue when patient has good quad control and no lag. ROM: PROM as  tolerated with gentle end range stretching. AROM and AAROM to tolerance without resistance. Exercises: Begin and advance SLR. Once full WB and no lag on SLR and no limp during gait begin and slowly advance closed chain quad/ core/ hamstring strengthening.   SUBJECTIVE: Pt reports no pain today, although she reports pain with bending the knee. She asks if she can discontinue her crutch and/or brace at this time. Pt reports adherence to her HEP.  PAIN:  Are you having pain? Yes: NPRS scale: 0/10 Pain location: Rt knee Pain description: Achy Aggravating factors: knee movement, walking Relieving factors: rest and pain medication   OBJECTIVE: (objective measures completed at initial evaluation unless otherwise dated)   DIAGNOSTIC FINDINGS: 02/18/2022: X-ray 2 Views Rt knee: Appropriate position of the tibial tubercle osteotomy. Hardware is in place without any hardware complicating features.    PATIENT SURVEYS:  LEFS 10/80 04/15/2022: LEFS: 50/80   COGNITION:           Overall cognitive status: Within functional limits for tasks assessed                          SENSATION: Not tested   EDEMA:  Circumferential: Rt: 40.5cm, Lt: 39.5cm     POSTURE: No Significant postural limitations   PALPATION: TTP about Rt tibial tubercle and medial joint line about surgical incisions   LOWER EXTREMITY ROM:   A/PROM Right eval Left eval Right 03/12/2022 Right 03/23/2022 Right 04/15/2022  Knee flexion 57p!/ 67p! 130/135 80p!/ 86 AAROM p!/ 92 PROM following MET 90p!/ 97p! PROM 110 AROM  Knee extension -5/0 5/8 -5/0 0/ 2 0 AROM   (Blank rows = not tested)   LOWER EXTREMITY MMT:   MMT Right eval Left eval Right 04/15/2022 Left 04/15/2022  Hip flexion 3+/5 3+/5 4+/5 4+/5  Hip extension 3/5 2+/5 4+/5 4/5  Hip abduction 4/5 3+/5 4+/5 4/5  Knee flexion Not tested per post-op protocol 5/5 4+/5 5/5  Knee extension Not tested per post-op protocol 5/5 4+/5 5/5  Ankle dorsiflexion 5/5 5/5    Ankle  plantarflexion 5/5 5/5     (Blank rows = not tested)     FUNCTIONAL TESTS:  5xSTS: 14 seconds with UE support  04/15/2022: 5xSTS: 10 seconds with no UE support  Rt SLR without brace: x10 without quad lag     GAIT: Distance walked: 20 ft Assistive device utilized: Crutches Level of assistance: Modified independence Comments: Pt ambulates TDMW on Rt with crutches       TODAY'S TREATMENT:  [place] Adult PT Treatment:                                                DATE: 04/15/2022 Therapeutic Exercise: 2-inch lateral heel taps in Rt stance 3x10 in // bars Eccentric heel raises on Airex pad in // bars 3x10 Standing marching on Airex pad with UE support as needed 3x20 Manual Therapy: N/A Neuromuscular re-ed: N/A Therapeutic Activity: Re-assessment of LEFS with pt education  Re-assessment of objective measures with pt education Modalities: N/A Self Care: N/A   [facility] Adult PT Treatment:                                                DATE: 03/25/2022 Therapeutic Exercise: Side-to-side weight-shifting on Airex pad 2x10 BIL Heel/toe raises on Airex pad 2x10 BIL Step up/ retro step down on Airex pad 2x10 Mini squat with brace unlocked to 30d of flexion 3x10 Manual Therapy: N/A Neuromuscular re-ed: N/A Therapeutic Activity: N/A Modalities: N/A Self Care: N/A  [facility] Adult PT Treatment:                                                DATE: 03/23/2022 Therapeutic Exercise: Supine heel slides to end range 3x30 Supine SLR with abduction/ adduction 2x8 on Rt Staggered bridge with Rt leg forward 3x8 Seated LAQ 2x10 with 3-sec hold at end range Manual Therapy: Rt knee flexion PROM with end range hold 4x24mn Neuromuscular re-ed: N/A Therapeutic Activity: N/A Modalities: Supine with Rt knee elevated, Game Ready vasopneumatic treatment at 34d F x10 minutes Self Care: N/A       HOME EXERCISE PROGRAM: Access Code: [email or web address]: [email or web address] Date:  03/02/2022 Prepared by: TVanessa [place]  Exercises - Long Sitting Quad Set  - 2 x daily - 7 x weekly - 2 sets - 10 reps - 20-sec hold - Heel slide with strap, LAYING ON BACK  - 2 x daily - 7 x weekly - 3 sets - 10 reps - 5-sec hold - Supine Isometric Hamstring Set  - 2 x daily - 7 x weekly - 2 sets - 10 reps - 20-sec hold - Supine Gluteal Sets  - 2 x daily - 7 x weekly - 2 sets - 10 reps - 20-sec hold   ASSESSMENT:   CLINICAL IMPRESSION: Due to pt arriving 10 minutes late to her appointment, the session was truncated. Upon re-assessment of objective measures, the pt demonstrates improvement in knee AROM, BIL global LE strength, quad control, and 5xSTS. She tolerated progressed exercises well today and is approaching the point in her post-op protocol where discontinuing the brace and crutch is warranted. The pt was instructed to wean off of these assistive devices as she is comfortable. Pt will continue to benefit from skilled PT to address her primary impairments and return to her prior level of function with less limitation.     OBJECTIVE IMPAIRMENTS Abnormal gait, decreased balance, decreased endurance, decreased mobility, difficulty walking, decreased ROM, decreased strength, hypomobility, increased edema, impaired flexibility, improper body mechanics, postural dysfunction, and pain.    ACTIVITY LIMITATIONS carrying, lifting, bending, sitting, standing, squatting, sleeping, stairs, transfers, bed mobility, and locomotion level   PARTICIPATION LIMITATIONS: meal prep, cleaning, laundry, interpersonal relationship, driving, shopping, community activity, and yard work   PERSONAL FACTORS  N/A  are also affecting patient's functional outcome.        GOALS: Goals reviewed with patient? Yes   SHORT TERM GOALS: Target date: 03/30/2022  Pt will report understanding and adherence to initial HEP in order to promote independence in the management of primary impairments. Baseline: HEP provided at  eval 03/12/2022: Pt reports adherence to her HEP Goal status: ACHIEVED  2.  Pt will achieve 0-90 degrees of active knee ROM in order to progress in her post-op protocol. Baseline: -5-57 degrees 03/12/2022: -5-80 degrees AROM 04/15/2022: 0-110 AROM Goal status: ACHIEVED   3.  Pt will achieve 10 SLR in brace locked in extension in order to progress in her post-op protocol. Baseline: Unable to perform 03/12/2022: Achieved Goal status: ACHIEVED       LONG TERM GOALS: Target date: 04/27/2022    Pt will achieve 0-125 degrees of Rt knee AROM in order to promote WNL gait. Baseline: -5-57 degrees 04/15/2022: 0-110 AROM Goal status: IN PROGRESS   2.  Pt will achieve a LEFS score of 40/80 or higher in order to demonstrate improved functional ability as it relates to her primary impairments. Baseline: 10/80 04/15/2022: 50/80 Goal status: ACHIEVED   3.  Pt will achieve global BIL LE strength of 4+/5 in order to progress her independent LE strengthening regimen with less limitation. Baseline: See MMT chart 04/15/2022: See updated MMT chart Goal status: IN PROGRESS   4.  Pt will report ability to walk/ stand > 30 minutes without AD in order to grocery shop with less limitation. Baseline: pain with any amount of walking/ standing with TDWB with crutches 04/15/2022: Pt reports ability to walk a couple of blocks without pain Goal status: IN PROGRESS   5.  Pt will achieve 5xSTS in 12 seconds or less without UE support in order to promote safe transfers. Baseline: 14 seconds with UE support 04/15/2022: 10 seconds without UE support Goal status: ACHIEVED       PLAN: PT FREQUENCY: 2x/week   PT DURATION: 8 weeks   PLANNED INTERVENTIONS: Therapeutic exercises, Therapeutic activity, Neuromuscular re-education, Balance training, Gait training, Patient/Family education, Joint mobilization, Stair training, DME instructions, Aquatic Therapy, Dry Needling, Electrical stimulation, Cryotherapy, Moist heat,  Manual lymph drainage, Compression bandaging, Taping, Vasopneumatic device, Biofeedback, Ionotophoresis 66m/ml Dexamethasone, Manual therapy, and Re-evaluation   PLAN FOR NEXT SESSION: Progress early LE strengthening/ ROM in accordance with post-op protocol; Advance to stage three at 3 months post-op    YVanessa Ruso PT, DPT 04/15/22 10:44 AM

## 2022-04-22 ENCOUNTER — Ambulatory Visit: Payer: Medicaid Other

## 2022-04-22 DIAGNOSIS — M25561 Pain in right knee: Secondary | ICD-10-CM | POA: Diagnosis not present

## 2022-04-22 DIAGNOSIS — M6281 Muscle weakness (generalized): Secondary | ICD-10-CM

## 2022-04-22 DIAGNOSIS — G8929 Other chronic pain: Secondary | ICD-10-CM

## 2022-04-22 DIAGNOSIS — R262 Difficulty in walking, not elsewhere classified: Secondary | ICD-10-CM

## 2022-04-22 NOTE — Therapy (Signed)
OUTPATIENT PHYSICAL THERAPY TREATMENT NOTE   Patient Name: Janet Mckee MRN: [id number] DOB:05/05/1996, 26 y.o., female Today's Date: 04/22/2022  PCP: No PCP REFERRING PROVIDER: Ophelia Charter, MD  END OF SESSION:   PT End of Session - 04/22/22 1408     Visit Number 7    Number of Visits 17    Date for PT Re-Evaluation 05/04/22    Authorization Type [employer]    Authorization Time Period 03/03/2022-05/31/2022    Authorization - Visit Number 7    Authorization - Number of Visits 10    PT Start Time 7408   Pt arrived 10 minutes late to her appointment.   PT Stop Time 1446    PT Time Calculation (min) 38 min    Activity Tolerance Patient tolerated treatment well    Behavior During Therapy WFL for tasks assessed/performed                 Past Medical History:  Diagnosis Date   Depression    PP with 2nd child, ok child   Gestational diabetes    Infection    UTI   Ovarian cyst    PONV (postoperative nausea and vomiting)    Past Surgical History:  Procedure Laterality Date   CESAREAN SECTION Bilateral 11/26/2019   Procedure: Cesarean section ;  Surgeon: Sanjuana Kava, MD;  Location: [place] [facility];  Service: Obstetrics;  Laterality: Bilateral;   CHONDROPLASTY Right 02/04/2022   Procedure: CHONDROPLASTY;  Surgeon: Hiram Gash, MD;  Location: [place];  Service: Orthopedics;  Laterality: Right;   FASCIOTOMY Right 02/04/2022   Procedure: FASCIOTOMY;  Surgeon: Hiram Gash, MD;  Location: [place];  Service: Orthopedics;  Laterality: Right;   Cyndie Chime SLIDE Right 02/04/2022   Procedure: Elmarie Mainland;  Surgeon: Hiram Gash, MD;  Location: [place];  Service: Orthopedics;  Laterality: Right;   KNEE RECONSTRUCTION Right 02/04/2022   Procedure: KNEE RECONSTRUCTION/MPPFL;  Surgeon: Hiram Gash, MD;  Location: [place];  Service: Orthopedics;  Laterality: Right;   KNEE SURGERY Bilateral    patellar  realignment   TONSILLECTOMY     Patient Active Problem List   Diagnosis Date Noted   Abdominal pain postpartum 12/04/2019   Abdominal pain during pregnancy, unspecified trimester 12/04/2019   Encounter for maternal care for low transverse scar from previous cesarean delivery 11/26/2019   Delivery by elective cesarean section 11/26/2019   Status post primary low transverse cesarean section 11/26/2019   Abnormal glucose tolerance test (GTT) during pregnancy, antepartum 10/24/2019    REFERRING DIAG: right knee arthroscopy with anterior medialization of the tubercle ,anterior compartment release and medial patellofemoral ligament reconstriction 02/04/2022  THERAPY DIAG:  Chronic pain of right knee  Muscle weakness (generalized)  Difficulty in walking, not elsewhere classified  Rationale for Evaluation and Treatment Rehabilitation  PERTINENT HISTORY: right knee arthroscopy with anterior medialization of the tubercle ,anterior compartment release and medial patellofemoral ligament reconstriction 02/04/2022, previous BIL patellar realignment 14 years ago  PRECAUTIONS:  Weeks 0-6: TDWB with crutches, hinged brace unlocked for all activities except sleeping, avoid active knee extension, immediate PROM to tolerance (goal 90 degrees by week 6), gentle quad sets, co-contraction, isometric quad/ hamstring in extension and at knee flexion >60 degrees. SLR with brace locked in extension.       Week 6-12: See evaluating PT for post-op protocol  Phase II (Weeks 6-12): Beginning week 6 gradually over 2-3 weeks progress to full. Plan  typically to wean from two crutches to 1 crutchto crutch free period. Full WBAT after conmpletion 8-9 weeks Brace: Discontinue while sleeping, but used for all for all ambulation and weightbearing exercises. Once full weightbearing ok to unlock 0-30 degrees first week full WB, 0-90 degrees next week. Discontinue when patient has good quad control and no lag. ROM: PROM as  tolerated with gentle end range stretching. AROM and AAROM to tolerance without resistance. Exercises: Begin and advance SLR. Once full WB and no lag on SLR and no limp during gait begin and slowly advance closed chain quad/ core/ hamstring strengthening.   SUBJECTIVE: Pt reports continuing to ambulate with her brace due to feeling unstable without it. She reports continued adherence to her HEP.  PAIN:  Are you having pain? Yes: NPRS scale: 0/10 Pain location: Rt knee Pain description: Achy Aggravating factors: knee movement, walking Relieving factors: rest and pain medication   OBJECTIVE: (objective measures completed at initial evaluation unless otherwise dated)   DIAGNOSTIC FINDINGS: 02/18/2022: X-ray 2 Views Rt knee: Appropriate position of the tibial tubercle osteotomy. Hardware is in place without any hardware complicating features.    PATIENT SURVEYS:  LEFS 10/80 04/15/2022: LEFS: 50/80   COGNITION:           Overall cognitive status: Within functional limits for tasks assessed                          SENSATION: Not tested   EDEMA:  Circumferential: Rt: 40.5cm, Lt: 39.5cm     POSTURE: No Significant postural limitations   PALPATION: TTP about Rt tibial tubercle and medial joint line about surgical incisions   LOWER EXTREMITY ROM:   A/PROM Right eval Left eval Right 03/12/2022 Right 03/23/2022 Right 04/15/2022  Knee flexion 57p!/ 67p! 130/135 80p!/ 86 AAROM p!/ 92 PROM following MET 90p!/ 97p! PROM 110 AROM  Knee extension -5/0 5/8 -5/0 0/ 2 0 AROM   (Blank rows = not tested)   LOWER EXTREMITY MMT:   MMT Right eval Left eval Right 04/15/2022 Left 04/15/2022  Hip flexion 3+/5 3+/5 4+/5 4+/5  Hip extension 3/5 2+/5 4+/5 4/5  Hip abduction 4/5 3+/5 4+/5 4/5  Knee flexion Not tested per post-op protocol 5/5 4+/5 5/5  Knee extension Not tested per post-op protocol 5/5 4+/5 5/5  Ankle dorsiflexion 5/5 5/5    Ankle plantarflexion 5/5 5/5     (Blank rows = not  tested)     FUNCTIONAL TESTS:  5xSTS: 14 seconds with UE support  04/15/2022: 5xSTS: 10 seconds with no UE support  Rt SLR without brace: x10 without quad lag     GAIT: Distance walked: 20 ft Assistive device utilized: Crutches Level of assistance: Modified independence Comments: Pt ambulates TDMW on Rt with crutches       TODAY'S TREATMENT:  [facility] Adult PT Treatment:                                                DATE: 15-May-2022 Therapeutic Exercise: Dead lift with 15# kettlebell in front of table 3x8 Alternating forward lunge with 5# kettlebell 3x8 BIL Mini-squat side step with 7# cable to waist attachment x3 walkouts BIL 4-inch lateral heel tap in Rt stance 2x10 with slow, controlled lowering  Sitting active Lt hamstring stretch x61mn Manual Therapy: N/A Neuromuscular re-ed: N/A Therapeutic  Activity: N/A Modalities: N/A Self Care: N/A   [facility] Adult PT Treatment:                                                DATE: 04/15/2022 Therapeutic Exercise: 2-inch lateral heel taps in Rt stance 3x10 in // bars Eccentric heel raises on Airex pad in // bars 3x10 Standing marching on Airex pad with UE support as needed 3x20 Manual Therapy: N/A Neuromuscular re-ed: N/A Therapeutic Activity: Re-assessment of LEFS with pt education Re-assessment of objective measures with pt education Modalities: N/A Self Care: N/A   [facility] Adult PT Treatment:                                                DATE: 03/25/2022 Therapeutic Exercise: Side-to-side weight-shifting on Airex pad 2x10 BIL Heel/toe raises on Airex pad 2x10 BIL Step up/ retro step down on Airex pad 2x10 Mini squat with brace unlocked to 30d of flexion 3x10 Manual Therapy: N/A Neuromuscular re-ed: N/A Therapeutic Activity: N/A Modalities: N/A Self Care: N/A       HOME EXERCISE PROGRAM: Access Code: [email or web address] URL: [email or web address] Date: 03/02/2022 Prepared by: Vanessa [place]    Exercises - Long Sitting Quad Set  - 2 x daily - 7 x weekly - 2 sets - 10 reps - 20-sec hold - Heel slide with strap, LAYING ON BACK  - 2 x daily - 7 x weekly - 3 sets - 10 reps - 5-sec hold - Supine Isometric Hamstring Set  - 2 x daily - 7 x weekly - 2 sets - 10 reps - 20-sec hold - Supine Gluteal Sets  - 2 x daily - 7 x weekly - 2 sets - 10 reps - 20-sec hold   ASSESSMENT:   CLINICAL IMPRESSION: Pt responded well to all progressed exercises today. She will benefit from further quadriceps reinforcement and eccentric strength training to assist with ambulation without her knee brace. She will benefit from continued skilled PT to address her primary impairments and return to her prior level of function with less limitation.     OBJECTIVE IMPAIRMENTS Abnormal gait, decreased balance, decreased endurance, decreased mobility, difficulty walking, decreased ROM, decreased strength, hypomobility, increased edema, impaired flexibility, improper body mechanics, postural dysfunction, and pain.    ACTIVITY LIMITATIONS carrying, lifting, bending, sitting, standing, squatting, sleeping, stairs, transfers, bed mobility, and locomotion level   PARTICIPATION LIMITATIONS: meal prep, cleaning, laundry, interpersonal relationship, driving, shopping, community activity, and yard work   PERSONAL FACTORS  N/A  are also affecting patient's functional outcome.        GOALS: Goals reviewed with patient? Yes   SHORT TERM GOALS: Target date: 03/30/2022  Pt will report understanding and adherence to initial HEP in order to promote independence in the management of primary impairments. Baseline: HEP provided at eval 03/12/2022: Pt reports adherence to her HEP Goal status: ACHIEVED   2.  Pt will achieve 0-90 degrees of active knee ROM in order to progress in her post-op protocol. Baseline: -5-57 degrees 03/12/2022: -5-80 degrees AROM 04/15/2022: 0-110 AROM Goal status: ACHIEVED   3.  Pt will achieve 10 SLR in  brace locked in extension in order to progress in her post-op protocol. Baseline: Unable to perform  03/12/2022: Achieved Goal status: ACHIEVED       LONG TERM GOALS: Target date: 04/27/2022    Pt will achieve 0-125 degrees of Rt knee AROM in order to promote WNL gait. Baseline: -5-57 degrees 04/15/2022: 0-110 AROM Goal status: IN PROGRESS   2.  Pt will achieve a LEFS score of 40/80 or higher in order to demonstrate improved functional ability as it relates to her primary impairments. Baseline: 10/80 04/15/2022: 50/80 Goal status: ACHIEVED   3.  Pt will achieve global BIL LE strength of 4+/5 in order to progress her independent LE strengthening regimen with less limitation. Baseline: See MMT chart 04/15/2022: See updated MMT chart Goal status: IN PROGRESS   4.  Pt will report ability to walk/ stand > 30 minutes without AD in order to grocery shop with less limitation. Baseline: pain with any amount of walking/ standing with TDWB with crutches 04/15/2022: Pt reports ability to walk a couple of blocks without pain Goal status: IN PROGRESS   5.  Pt will achieve 5xSTS in 12 seconds or less without UE support in order to promote safe transfers. Baseline: 14 seconds with UE support 04/15/2022: 10 seconds without UE support Goal status: ACHIEVED       PLAN: PT FREQUENCY: 2x/week   PT DURATION: 8 weeks   PLANNED INTERVENTIONS: Therapeutic exercises, Therapeutic activity, Neuromuscular re-education, Balance training, Gait training, Patient/Family education, Joint mobilization, Stair training, DME instructions, Aquatic Therapy, Dry Needling, Electrical stimulation, Cryotherapy, Moist heat, Manual lymph drainage, Compression bandaging, Taping, Vasopneumatic device, Biofeedback, Ionotophoresis 69m/ml Dexamethasone, Manual therapy, and Re-evaluation   PLAN FOR NEXT SESSION: Progress early LE strengthening/ ROM in accordance with post-op protocol; Advance to stage three at 3 months  post-op    YVanessa Edgeley PT, DPT 04/22/22 2:47 PM

## 2022-04-29 ENCOUNTER — Ambulatory Visit: Payer: Medicaid Other | Attending: Orthopaedic Surgery | Admitting: Physical Therapy

## 2022-04-29 ENCOUNTER — Encounter: Payer: Self-pay | Admitting: Physical Therapy

## 2022-04-29 DIAGNOSIS — M6281 Muscle weakness (generalized): Secondary | ICD-10-CM | POA: Diagnosis present

## 2022-04-29 DIAGNOSIS — R262 Difficulty in walking, not elsewhere classified: Secondary | ICD-10-CM | POA: Insufficient documentation

## 2022-04-29 DIAGNOSIS — M25561 Pain in right knee: Secondary | ICD-10-CM | POA: Diagnosis present

## 2022-04-29 DIAGNOSIS — G8929 Other chronic pain: Secondary | ICD-10-CM | POA: Diagnosis present

## 2022-04-29 NOTE — Therapy (Signed)
OUTPATIENT PHYSICAL THERAPY TREATMENT NOTE   Patient Name: Janet Mckee MRN: [id number] DOB:09/12/1995, 26 y.o., female Today's Date: 04/29/2022  PCP: No PCP REFERRING PROVIDER: Ophelia Charter, MD  END OF SESSION:   PT End of Session - 04/29/22 0749     Visit Number 8    Number of Visits 17    Date for PT Re-Evaluation 05/04/22    Authorization Type [employer]    Authorization Time Period 03/03/2022-05/31/2022    Authorization - Visit Number 8    Authorization - Number of Visits 10    PT Start Time 0748    PT Stop Time 0828    PT Time Calculation (min) 40 min    Activity Tolerance Patient tolerated treatment well    Behavior During Therapy WFL for tasks assessed/performed                 Past Medical History:  Diagnosis Date   Depression    PP with 2nd child, ok child   Gestational diabetes    Infection    UTI   Ovarian cyst    PONV (postoperative nausea and vomiting)    Past Surgical History:  Procedure Laterality Date   CESAREAN SECTION Bilateral 11/26/2019   Procedure: Cesarean section ;  Surgeon: Sanjuana Kava, MD;  Location: [facility];  Service: Obstetrics;  Laterality: Bilateral;   CHONDROPLASTY Right 02/04/2022   Procedure: CHONDROPLASTY;  Surgeon: Hiram Gash, MD;  Location: [place];  Service: Orthopedics;  Laterality: Right;   FASCIOTOMY Right 02/04/2022   Procedure: FASCIOTOMY;  Surgeon: Hiram Gash, MD;  Location: [place];  Service: Orthopedics;  Laterality: Right;   Cyndie Chime SLIDE Right 02/04/2022   Procedure: Elmarie Mainland;  Surgeon: Hiram Gash, MD;  Location: [place];  Service: Orthopedics;  Laterality: Right;   KNEE RECONSTRUCTION Right 02/04/2022   Procedure: KNEE RECONSTRUCTION/MPPFL;  Surgeon: Hiram Gash, MD;  Location: [place];  Service: Orthopedics;  Laterality: Right;   KNEE SURGERY Bilateral    patellar realignment   TONSILLECTOMY     Patient Active  Problem List   Diagnosis Date Noted   Abdominal pain postpartum 12/04/2019   Abdominal pain during pregnancy, unspecified trimester 12/04/2019   Encounter for maternal care for low transverse scar from previous cesarean delivery 11/26/2019   Delivery by elective cesarean section 11/26/2019   Status post primary low transverse cesarean section 11/26/2019   Abnormal glucose tolerance test (GTT) during pregnancy, antepartum 10/24/2019    REFERRING DIAG: right knee arthroscopy with anterior medialization of the tubercle ,anterior compartment release and medial patellofemoral ligament reconstriction 02/04/2022  THERAPY DIAG:  Chronic pain of right knee  Muscle weakness (generalized)  Difficulty in walking, not elsewhere classified  Rationale for Evaluation and Treatment Rehabilitation  PERTINENT HISTORY: right knee arthroscopy with anterior medialization of the tubercle ,anterior compartment release and medial patellofemoral ligament reconstriction 02/04/2022, previous BIL patellar realignment 14 years ago  PRECAUTIONS:  Weeks 0-6: TDWB with crutches, hinged brace unlocked for all activities except sleeping, avoid active knee extension, immediate PROM to tolerance (goal 90 degrees by week 6), gentle quad sets, co-contraction, isometric quad/ hamstring in extension and at knee flexion >60 degrees. SLR with brace locked in extension.       Week 6-12: See evaluating PT for post-op protocol  Phase II (Weeks 6-12): Beginning week 6 gradually over 2-3 weeks progress to full. Plan typically to wean from two crutches to 1 crutchto  crutch free period. Full WBAT after conmpletion 8-9 weeks Brace: Discontinue while sleeping, but used for all for all ambulation and weightbearing exercises. Once full weightbearing ok to unlock 0-30 degrees first week full WB, 0-90 degrees next week. Discontinue when patient has good quad control and no lag. ROM: PROM as tolerated with gentle end range stretching. AROM and  AAROM to tolerance without resistance. Exercises: Begin and advance SLR. Once full WB and no lag on SLR and no limp during gait begin and slowly advance closed chain quad/ core/ hamstring strengthening.   SUBJECTIVE: Pt reports that she feels a little unstable without her brace, but feels she is improving.  PAIN:  Are you having pain? Yes: NPRS scale: 0/10 Pain location: Rt knee Pain description: Achy Aggravating factors: knee movement, walking Relieving factors: rest and pain medication   OBJECTIVE: (objective measures completed at initial evaluation unless otherwise dated)   DIAGNOSTIC FINDINGS: 02/18/2022: X-ray 2 Views Rt knee: Appropriate position of the tibial tubercle osteotomy. Hardware is in place without any hardware complicating features.    PATIENT SURVEYS:  LEFS 10/80 04/15/2022: LEFS: 50/80   COGNITION:           Overall cognitive status: Within functional limits for tasks assessed                          SENSATION: Not tested   EDEMA:  Circumferential: Rt: 40.5cm, Lt: 39.5cm     POSTURE: No Significant postural limitations   PALPATION: TTP about Rt tibial tubercle and medial joint line about surgical incisions   LOWER EXTREMITY ROM:   A/PROM Right eval Left eval Right 03/12/2022 Right 03/23/2022 Right 04/15/2022  Knee flexion 57p!/ 67p! 130/135 80p!/ 86 AAROM p!/ 92 PROM following MET 90p!/ 97p! PROM 110 AROM  Knee extension -5/0 5/8 -5/0 0/ 2 0 AROM   (Blank rows = not tested)   LOWER EXTREMITY MMT:   MMT Right eval Left eval Right 04/15/2022 Left 04/15/2022  Hip flexion 3+/5 3+/5 4+/5 4+/5  Hip extension 3/5 2+/5 4+/5 4/5  Hip abduction 4/5 3+/5 4+/5 4/5  Knee flexion Not tested per post-op protocol 5/5 4+/5 5/5  Knee extension Not tested per post-op protocol 5/5 4+/5 5/5  Ankle dorsiflexion 5/5 5/5    Ankle plantarflexion 5/5 5/5     (Blank rows = not tested)     FUNCTIONAL TESTS:  5xSTS: 14 seconds with UE support  04/15/2022: 5xSTS: 10  seconds with no UE support  Rt SLR without brace: x10 without quad lag     GAIT: Distance walked: 20 ft Assistive device utilized: Crutches Level of assistance: Modified independence Comments: Pt ambulates TDMW on Rt with crutches       TODAY'S TREATMENT:  [facility] Adult PT Treatment:                                                DATE: 04/22/2022 Therapeutic Exercise:  Bike - 5 min for warm up and ROM SLR - 2# - concentration on quad activation - 3x10 S/L SLR - 2# - 3x10 SLS with paloff press - 2x10 ea direction - GTB TKE - 2x20 Retro TM walking - 3 bouts to fatigue 2-inch lateral heel tap in Rt stance 2x10 with slow, controlled lowering (unable to control d/c'd)  Neuromuscular re-ed: Tandem on foam -  52'' bouts SLS - 45'' bouts   [facility] Adult PT Treatment:                                                DATE: 04/15/2022 Therapeutic Exercise: 2-inch lateral heel taps in Rt stance 3x10 in // bars Eccentric heel raises on Airex pad in // bars 3x10 Standing marching on Airex pad with UE support as needed 3x20 Manual Therapy: N/A Neuromuscular re-ed: N/A Therapeutic Activity: Re-assessment of LEFS with pt education Re-assessment of objective measures with pt education Modalities: N/A Self Care: N/A   [facility] Adult PT Treatment:                                                DATE: 03/25/2022 Therapeutic Exercise: Side-to-side weight-shifting on Airex pad 2x10 BIL Heel/toe raises on Airex pad 2x10 BIL Step up/ retro step down on Airex pad 2x10 Mini squat with brace unlocked to 30d of flexion 3x10 Manual Therapy: N/A Neuromuscular re-ed: N/A Therapeutic Activity: N/A Modalities: N/A Self Care: N/A       HOME EXERCISE PROGRAM: Access Code: [email or web address] URL: [email or web address] Date: 03/02/2022 Prepared by: Vanessa [place]   Exercises - Long Sitting Quad Set  - 2 x daily - 7 x weekly - 2 sets - 10 reps - 20-sec hold - Heel slide with strap, LAYING  ON BACK  - 2 x daily - 7 x weekly - 3 sets - 10 reps - 5-sec hold - Supine Isometric Hamstring Set  - 2 x daily - 7 x weekly - 2 sets - 10 reps - 20-sec hold - Supine Gluteal Sets  - 2 x daily - 7 x weekly - 2 sets - 10 reps - 20-sec hold   ASSESSMENT:   CLINICAL IMPRESSION: Tamina tolerated session well with no adverse reaction.  We concentrated on isometric quad activation as well as closed chain strengthening.  She still is challenged with SLR, but is able to complete without lag.  Suggested she start working on SLS at home as this is challenging today.     OBJECTIVE IMPAIRMENTS Abnormal gait, decreased balance, decreased endurance, decreased mobility, difficulty walking, decreased ROM, decreased strength, hypomobility, increased edema, impaired flexibility, improper body mechanics, postural dysfunction, and pain.    ACTIVITY LIMITATIONS carrying, lifting, bending, sitting, standing, squatting, sleeping, stairs, transfers, bed mobility, and locomotion level   PARTICIPATION LIMITATIONS: meal prep, cleaning, laundry, interpersonal relationship, driving, shopping, community activity, and yard work   PERSONAL FACTORS  N/A  are also affecting patient's functional outcome.        GOALS: Goals reviewed with patient? Yes   SHORT TERM GOALS: Target date: 03/30/2022  Pt will report understanding and adherence to initial HEP in order to promote independence in the management of primary impairments. Baseline: HEP provided at eval 03/12/2022: Pt reports adherence to her HEP Goal status: ACHIEVED   2.  Pt will achieve 0-90 degrees of active knee ROM in order to progress in her post-op protocol. Baseline: -5-57 degrees 03/12/2022: -5-80 degrees AROM 04/15/2022: 0-110 AROM Goal status: ACHIEVED   3.  Pt will achieve 10 SLR in brace locked in extension in order to progress in her post-op protocol. Baseline: Unable to perform 03/12/2022: Achieved Goal  status: ACHIEVED       LONG TERM GOALS:  Target date: 04/27/2022    Pt will achieve 0-125 degrees of Rt knee AROM in order to promote WNL gait. Baseline: -5-57 degrees 04/15/2022: 0-110 AROM Goal status: IN PROGRESS   2.  Pt will achieve a LEFS score of 40/80 or higher in order to demonstrate improved functional ability as it relates to her primary impairments. Baseline: 10/80 04/15/2022: 50/80 Goal status: ACHIEVED   3.  Pt will achieve global BIL LE strength of 4+/5 in order to progress her independent LE strengthening regimen with less limitation. Baseline: See MMT chart 04/15/2022: See updated MMT chart Goal status: IN PROGRESS   4.  Pt will report ability to walk/ stand > 30 minutes without AD in order to grocery shop with less limitation. Baseline: pain with any amount of walking/ standing with TDWB with crutches 04/15/2022: Pt reports ability to walk a couple of blocks without pain Goal status: IN PROGRESS   5.  Pt will achieve 5xSTS in 12 seconds or less without UE support in order to promote safe transfers. Baseline: 14 seconds with UE support 04/15/2022: 10 seconds without UE support Goal status: ACHIEVED       PLAN: PT FREQUENCY: 2x/week   PT DURATION: 8 weeks   PLANNED INTERVENTIONS: Therapeutic exercises, Therapeutic activity, Neuromuscular re-education, Balance training, Gait training, Patient/Family education, Joint mobilization, Stair training, DME instructions, Aquatic Therapy, Dry Needling, Electrical stimulation, Cryotherapy, Moist heat, Manual lymph drainage, Compression bandaging, Taping, Vasopneumatic device, Biofeedback, Ionotophoresis 57m/ml Dexamethasone, Manual therapy, and Re-evaluation   PLAN FOR NEXT SESSION: Progress early LE strengthening/ ROM in accordance with post-op protocol; Advance to stage three at 3 months post-op    KMathis DadPT 04/29/22 8:28 AM

## 2022-05-05 ENCOUNTER — Ambulatory Visit: Payer: Medicaid Other

## 2022-05-05 DIAGNOSIS — R262 Difficulty in walking, not elsewhere classified: Secondary | ICD-10-CM

## 2022-05-05 DIAGNOSIS — M6281 Muscle weakness (generalized): Secondary | ICD-10-CM

## 2022-05-05 DIAGNOSIS — G8929 Other chronic pain: Secondary | ICD-10-CM

## 2022-05-05 DIAGNOSIS — M25561 Pain in right knee: Secondary | ICD-10-CM | POA: Diagnosis not present

## 2022-05-05 NOTE — Therapy (Addendum)
OUTPATIENT PHYSICAL THERAPY TREATMENT NOTE/ RE-CERTIFICATION/ RE-AUTHORIZATION/ DISCHARGE SUMMARY   Patient Name: Janet Mckee MRN: [id number] DOB:1996-06-20, 26 y.o., female Today's Date: 05/05/2022  PCP: No PCP REFERRING PROVIDER: Ophelia Charter, MD  END OF SESSION:   PT End of Session - 05/05/22 1139     Visit Number 9    Number of Visits 17    Date for PT Re-Evaluation 06/30/22    Authorization Type [employer]    Authorization Time Period 03/03/2022-05/31/2022    Authorization - Visit Number 9    Authorization - Number of Visits 10    PT Start Time 1829    PT Stop Time 1214    PT Time Calculation (min) 38 min    Activity Tolerance Patient tolerated treatment well    Behavior During Therapy WFL for tasks assessed/performed                  Past Medical History:  Diagnosis Date   Depression    PP with 2nd child, ok child   Gestational diabetes    Infection    UTI   Ovarian cyst    PONV (postoperative nausea and vomiting)    Past Surgical History:  Procedure Laterality Date   CESAREAN SECTION Bilateral 11/26/2019   Procedure: Cesarean section ;  Surgeon: Sanjuana Kava, MD;  Location: [facility];  Service: Obstetrics;  Laterality: Bilateral;   CHONDROPLASTY Right 02/04/2022   Procedure: CHONDROPLASTY;  Surgeon: Hiram Gash, MD;  Location: [place];  Service: Orthopedics;  Laterality: Right;   FASCIOTOMY Right 02/04/2022   Procedure: FASCIOTOMY;  Surgeon: Hiram Gash, MD;  Location: [place];  Service: Orthopedics;  Laterality: Right;   Cyndie Chime SLIDE Right 02/04/2022   Procedure: Elmarie Mainland;  Surgeon: Hiram Gash, MD;  Location: [place];  Service: Orthopedics;  Laterality: Right;   KNEE RECONSTRUCTION Right 02/04/2022   Procedure: KNEE RECONSTRUCTION/MPPFL;  Surgeon: Hiram Gash, MD;  Location: [place];  Service: Orthopedics;  Laterality: Right;   KNEE SURGERY Bilateral     patellar realignment   TONSILLECTOMY     Patient Active Problem List   Diagnosis Date Noted   Abdominal pain postpartum 12/04/2019   Abdominal pain during pregnancy, unspecified trimester 12/04/2019   Encounter for maternal care for low transverse scar from previous cesarean delivery 11/26/2019   Delivery by elective cesarean section 11/26/2019   Status post primary low transverse cesarean section 11/26/2019   Abnormal glucose tolerance test (GTT) during pregnancy, antepartum 10/24/2019    REFERRING DIAG: right knee arthroscopy with anterior medialization of the tubercle ,anterior compartment release and medial patellofemoral ligament reconstriction 02/04/2022  THERAPY DIAG:  Chronic pain of right knee - Plan: PT plan of care cert/re-cert  Muscle weakness (generalized) - Plan: PT plan of care cert/re-cert  Difficulty in walking, not elsewhere classified - Plan: PT plan of care cert/re-cert  Rationale for Evaluation and Treatment Rehabilitation  PERTINENT HISTORY: right knee arthroscopy with anterior medialization of the tubercle ,anterior compartment release and medial patellofemoral ligament reconstriction 02/04/2022, previous BIL patellar realignment 14 years ago  PRECAUTIONS:  Weeks 0-6: TDWB with crutches, hinged brace unlocked for all activities except sleeping, avoid active knee extension, immediate PROM to tolerance (goal 90 degrees by week 6), gentle quad sets, co-contraction, isometric quad/ hamstring in extension and at knee flexion >60 degrees. SLR with brace locked in extension.       Week 6-12: See evaluating PT for post-op  protocol  Phase II (Weeks 6-12): Beginning week 6 gradually over 2-3 weeks progress to full. Plan typically to wean from two crutches to 1 crutchto crutch free period. Full WBAT after conmpletion 8-9 weeks Brace: Discontinue while sleeping, but used for all for all ambulation and weightbearing exercises. Once full weightbearing ok to unlock 0-30 degrees  first week full WB, 0-90 degrees next week. Discontinue when patient has good quad control and no lag. ROM: PROM as tolerated with gentle end range stretching. AROM and AAROM to tolerance without resistance. Exercises: Begin and advance SLR. Once full WB and no lag on SLR and no limp during gait begin and slowly advance closed chain quad/ core/ hamstring strengthening.   SUBJECTIVE: Pt arrives without her knee brace today. She reports continued improvement in her knee strength and ROM, although she is still lacking control with exercises like lateral heel taps.  PAIN:  Are you having pain? Yes: NPRS scale: 0/10 Pain location: Rt knee Pain description: Achy Aggravating factors: knee movement, walking Relieving factors: rest and pain medication   OBJECTIVE: (objective measures completed at initial evaluation unless otherwise dated)   DIAGNOSTIC FINDINGS: 02/18/2022: X-ray 2 Views Rt knee: Appropriate position of the tibial tubercle osteotomy. Hardware is in place without any hardware complicating features.    PATIENT SURVEYS:  LEFS 10/80 04/15/2022: LEFS: 50/80   COGNITION:           Overall cognitive status: Within functional limits for tasks assessed                          SENSATION: Not tested   EDEMA:  Circumferential: Rt: 40.5cm, Lt: 39.5cm     POSTURE: No Significant postural limitations   PALPATION: TTP about Rt tibial tubercle and medial joint line about surgical incisions   LOWER EXTREMITY ROM:   A/PROM Right eval Left eval Right 03/12/2022 Right 03/23/2022 Right 04/15/2022 Right 05/05/2022  Knee flexion 57p!/ 67p! 130/135 80p!/ 86 AAROM p!/ 92 PROM following MET 90p!/ 97p! PROM 110 AROM 114 AROM  Knee extension -5/0 5/8 -5/0 0/ 2 0 AROM 0 AROM   (Blank rows = not tested)   LOWER EXTREMITY MMT:   MMT Right eval Left eval Right 04/15/2022 Left 04/15/2022 Right 05/05/2022 Left 05/05/2022  Hip flexion 3+/5 3+/5 4+/5 4+/5 5/5 5/5  Hip extension 3/5 2+/5 4+/5 4/5  4+/5 4+/5  Hip abduction 4/5 3+/5 4+/5 4/5 5/5 4/5  Knee flexion Not tested per post-op protocol 5/5 4+/5 5/5 5/5   Knee extension Not tested per post-op protocol 5/5 4+/5 5/5 4/5p!   Ankle dorsiflexion 5/5 5/5      Ankle plantarflexion 5/5 5/5       (Blank rows = not tested)     FUNCTIONAL TESTS:  5xSTS: 14 seconds with UE support  04/15/2022: 5xSTS: 10 seconds with no UE support  Rt SLR without brace: x10 without quad lag     GAIT: Distance walked: 20 ft Assistive device utilized: Crutches Level of assistance: Modified independence Comments: Pt ambulates TDMW on Rt with crutches       TODAY'S TREATMENT:  [facility] Adult PT Treatment:                                                DATE: 05/05/2022 Therapeutic Exercise: [place] split squat with UE support 3x10  BIL Mini-squat side step with 7# cable to waist attachment at Free Motion machine 2x5 walkouts BIL Mini-squat backward walking with 7# cable to waist attachment at Free Motion machine 2x5 Standing hip extension with 7# cable to ankle attachment 2x10 BIL Standing hip flexion with subsequent knee extension with 3# cable x10 BIL Manual Therapy: N/A Neuromuscular re-ed: N/A Therapeutic Activity: Re-assessment of objective measures with pt education Modalities: N/A Self Care: N/A   [facility] Adult PT Treatment:                                                DATE: 04/22/2022 Therapeutic Exercise:  Bike - 5 min for warm up and ROM SLR - 2# - concentration on quad activation - 3x10 S/L SLR - 2# - 3x10 SLS with paloff press - 2x10 ea direction - GTB TKE - 2x20 Retro TM walking - 3 bouts to fatigue 2-inch lateral heel tap in Rt stance 2x10 with slow, controlled lowering (unable to control d/c'd)  Neuromuscular re-ed: Tandem on foam - 45'' bouts SLS - 45'' bouts   [facility] Adult PT Treatment:                                                DATE: 04/15/2022 Therapeutic Exercise: 2-inch lateral heel taps in Rt stance 3x10 in  // bars Eccentric heel raises on Airex pad in // bars 3x10 Standing marching on Airex pad with UE support as needed 3x20 Manual Therapy: N/A Neuromuscular re-ed: N/A Therapeutic Activity: Re-assessment of LEFS with pt education Re-assessment of objective measures with pt education Modalities: N/A Self Care: N/A        HOME EXERCISE PROGRAM: Access Code: [email or web address] URL: [email or web address] Date: 03/02/2022 Prepared by: Vanessa [place]   Exercises - Long Sitting Quad Set  - 2 x daily - 7 x weekly - 2 sets - 10 reps - 20-sec hold - Heel slide with strap, LAYING ON BACK  - 2 x daily - 7 x weekly - 3 sets - 10 reps - 5-sec hold - Supine Isometric Hamstring Set  - 2 x daily - 7 x weekly - 2 sets - 10 reps - 20-sec hold - Supine Gluteal Sets  - 2 x daily - 7 x weekly - 2 sets - 10 reps - 20-sec hold   ASSESSMENT:   CLINICAL IMPRESSION: Pt presents 12 weeks and 6 days s/p Rt knee arthroscopy with anterior medialization of the tubercle ,anterior compartment release and medial patellofemoral ligament reconstriction on 02/04/2022. POC is progressing to phase III of her post-op rehab. She continues to have limitation on Rt knee flexion AROM and knee extension strength with associated pain. Pt will benefit from continued skilled PT to address her primary impairments and return to her prior level of function with less limitation.     OBJECTIVE IMPAIRMENTS Abnormal gait, decreased balance, decreased endurance, decreased mobility, difficulty walking, decreased ROM, decreased strength, hypomobility, increased edema, impaired flexibility, improper body mechanics, postural dysfunction, and pain.    ACTIVITY LIMITATIONS carrying, lifting, bending, sitting, standing, squatting, sleeping, stairs, transfers, bed mobility, and locomotion level   PARTICIPATION LIMITATIONS: meal prep, cleaning, laundry, interpersonal relationship, driving, shopping, community activity, and yard work    PERSONAL FACTORS  N/A  are also affecting patient's functional outcome.        GOALS: Goals reviewed with patient? Yes   SHORT TERM GOALS: Target date: 03/30/2022  Pt will report understanding and adherence to initial HEP in order to promote independence in the management of primary impairments. Baseline: HEP provided at eval 03/12/2022: Pt reports adherence to her HEP Goal status: ACHIEVED   2.  Pt will achieve 0-90 degrees of active knee ROM in order to progress in her post-op protocol. Baseline: -5-57 degrees 03/12/2022: -5-80 degrees AROM 04/15/2022: 0-110 AROM Goal status: ACHIEVED   3.  Pt will achieve 10 SLR in brace locked in extension in order to progress in her post-op protocol. Baseline: Unable to perform 03/12/2022: Achieved Goal status: ACHIEVED       LONG TERM GOALS: Target date: 04/27/2022    Pt will achieve 0-125 degrees of Rt knee AROM in order to promote WNL gait. Baseline: -5-57 degrees 04/15/2022: 0-110 AROM 05/05/2022: 0-114 AROM Goal status: IN PROGRESS   2.  Pt will achieve a LEFS score of 40/80 or higher in order to demonstrate improved functional ability as it relates to her primary impairments. Baseline: 10/80 04/15/2022: 50/80 Goal status: ACHIEVED   3.  Pt will achieve global BIL LE strength of 4+/5 in order to progress her independent LE strengthening regimen with less limitation. Baseline: See MMT chart 04/15/2022: See updated MMT chart 05/05/2022: See updated MMT chart Goal status: IN PROGRESS   4.  Pt will report ability to walk/ stand > 30 minutes without AD in order to grocery shop with less limitation. Baseline: pain with any amount of walking/ standing with TDWB with crutches 04/15/2022: Pt reports ability to walk a couple of blocks without pain Goal status: IN PROGRESS   5.  Pt will achieve 5xSTS in 12 seconds or less without UE support in order to promote safe transfers. Baseline: 14 seconds with UE support 04/15/2022: 10 seconds without UE  support Goal status: ACHIEVED       PLAN: PT FREQUENCY: 1x/week   PT DURATION: 8 weeks   PLANNED INTERVENTIONS: Therapeutic exercises, Therapeutic activity, Neuromuscular re-education, Balance training, Gait training, Patient/Family education, Joint mobilization, Stair training, DME instructions, Aquatic Therapy, Dry Needling, Electrical stimulation, Cryotherapy, Moist heat, Manual lymph drainage, Compression bandaging, Taping, Vasopneumatic device, Biofeedback, Ionotophoresis 47m/ml Dexamethasone, Manual therapy, and Re-evaluation   PLAN FOR NEXT SESSION: Progress early LE strengthening/ ROM in accordance with post-op protocol; Advance to stage three at 3 months post-op  Check all possible CPT codes: 97164 - PT Re-evaluation, 97110- Therapeutic Exercise, [phone] Neuro Re-education, [phone]- Gait Training, [phone]- Manual Therapy, 97530 - Therapeutic Activities, 97535 - Self Care, 97014 - Electrical stimulation (unattended), [id number]- Electrical stimulation (Manual), [id number]- Ultrasound, [id number]- Vaso, [id number]- Physical performance training, and [id number]- Aquatic therapy     If treatment provided at initial evaluation, no treatment charged due to lack of authorization.       YVanessa [place] PT, DPT 05/05/22 12:14 PM   PHYSICAL THERAPY DISCHARGE SUMMARY  Visits from Start of Care: 9  Current functional level related to goals / functional outcomes: Pt has improved her knee ROM, LE strength, and walking capacity   Remaining deficits: Pt continued to be limited in LE strength   Education / Equipment: HEP   Patient agrees to discharge. Patient goals were partially met. Patient is being discharged due to not returning since the last visit.  YVanessa [place] PT, DPT 06/30/22 11:22 AM

## 2022-05-13 ENCOUNTER — Ambulatory Visit: Payer: Medicaid Other

## 2022-05-17 ENCOUNTER — Ambulatory Visit
Admission: EM | Admit: 2022-05-17 | Discharge: 2022-05-17 | Disposition: A | Discharge status: Home / Self Care | Payer: Medicaid Other | Attending: Urgent Care | Admitting: Urgent Care

## 2022-05-17 DIAGNOSIS — B349 Viral infection, unspecified: Secondary | ICD-10-CM | POA: Diagnosis not present

## 2022-05-17 DIAGNOSIS — Z20822 Contact with and (suspected) exposure to covid-19: Secondary | ICD-10-CM | POA: Diagnosis not present

## 2022-05-17 LAB — RESP PANEL BY RT-PCR (FLU A&B, COVID) ARPGX2
Influenza A by PCR: NEGATIVE
Influenza B by PCR: NEGATIVE
SARS Coronavirus 2 by RT PCR: NEGATIVE

## 2022-05-17 MED ORDER — PROMETHAZINE-DM 6.25-15 MG/5ML PO SYRP: 2.5000 mL | ORAL_SOLUTION | Freq: Three times a day (TID) | ORAL | 0 refills | Status: DC | PRN

## 2022-05-17 MED ORDER — ONDANSETRON 8 MG PO TBDP: 8.0000 mg | ORAL_TABLET | Freq: Three times a day (TID) | ORAL | 0 refills | Status: DC | PRN

## 2022-05-17 MED ORDER — KETOROLAC TROMETHAMINE 60 MG/2ML IM SOLN
60.0000 mg | Freq: Once | INTRAMUSCULAR | Status: AC
  Administered 2022-05-17: 60 mg via INTRAMUSCULAR

## 2022-05-17 MED ORDER — IBUPROFEN 600 MG PO TABS: 600.0000 mg | ORAL_TABLET | Freq: Four times a day (QID) | ORAL | 0 refills | Status: DC | PRN

## 2022-05-17 NOTE — ED Triage Notes (Signed)
Pt c/o lack of taste/ smell, weakness, headache, and upset stomach.  Home interventions: tylenol, motrin  Started: 3 days ago

## 2022-05-17 NOTE — Discharge Instructions (Addendum)
We will notify you of your test results as they arrive and may take between about 24 hours.  I encourage you to sign up for MyChart if you have not already done so as this can be the easiest way for [place] to communicate results to you online or through a phone app.  Generally, we only contact you if it is a positive test result.  In the meantime, if you develop worsening symptoms including fever, chest pain, shortness of breath despite our current treatment plan then please report to the emergency room as this may be a sign of worsening status from possible viral infection.  Otherwise, we will manage this as a viral syndrome. For sore throat or cough try using a honey-based tea. Use 3 teaspoons of honey with juice squeezed from half lemon. Place shaved pieces of ginger into 1/2-1 cup of water and warm over stove top. Then mix the ingredients and repeat every 4 hours as needed. Please take Tylenol 500mg -650mg  every 6 hours for aches and pains, fevers. Hydrate very well with at least 2 liters of water. Eat light meals such as soups to replenish electrolytes and soft fruits, veggies. Start an antihistamine like Zyrtec for postnasal drainage, sinus congestion.  You can take this together with pseudoephedrine (Sudafed) at a dose of 60 mg 2-3 times a day as needed for the same kind of congestion.  Use the cough medications as needed.  Use ibuprofen for your aches and pains.   We will let you know if you test positive for COVID tomorrow and if you will need COVID anti-viral medication, Paxlovid.

## 2022-05-17 NOTE — ED Provider Notes (Signed)
[facility]  Note:  This document was prepared using Conservation officer, historic buildings and may include unintentional dictation errors.  MRN: [id number] DOB: 12-05-1995  Subjective:   Janet Mckee is a 26 y.o. female presenting for 3 day history of acute onset persistent malaise, fatigue, severe body pains, headaches, weakness, upset stomach, loss of taste, loss of smell.  Patient has also had chest pain and is trying to suppress her cough because it hurts very badly to cough.  No history of asthma.  Patient is not a smoker.  No history of migraines.  Takes Alesse.   Allergies  Allergen Reactions   Contrast Media [Iodinated Contrast Media] Hives   Sulfa Antibiotics Hives    Past Medical History:  Diagnosis Date   Depression    PP with 2nd child, ok child   Gestational diabetes    Infection    UTI   Ovarian cyst    PONV (postoperative nausea and vomiting)      Past Surgical History:  Procedure Laterality Date   CESAREAN SECTION Bilateral 11/26/2019   Procedure: Cesarean section ;  Surgeon: Essie Hart, MD;  Location: [facility];  Service: Obstetrics;  Laterality: Bilateral;   CHONDROPLASTY Right 02/04/2022   Procedure: CHONDROPLASTY;  Surgeon: Bjorn Pippin, MD;  Location: [facility];  Service: Orthopedics;  Laterality: Right;   FASCIOTOMY Right 02/04/2022   Procedure: FASCIOTOMY;  Surgeon: Bjorn Pippin, MD;  Location: [facility];  Service: Orthopedics;  Laterality: Right;   Guido Sander SLIDE Right 02/04/2022   Procedure: Conan Bowens;  Surgeon: Bjorn Pippin, MD;  Location: [facility];  Service: Orthopedics;  Laterality: Right;   KNEE RECONSTRUCTION Right 02/04/2022   Procedure: KNEE RECONSTRUCTION/MPPFL;  Surgeon: Bjorn Pippin, MD;  Location: [facility];  Service: Orthopedics;  Laterality: Right;   KNEE SURGERY Bilateral    patellar realignment   TONSILLECTOMY      Family History   Problem Relation Age of Onset   Anemia Mother     Social History   Tobacco Use   Smoking status: Never   Smokeless tobacco: Never  Vaping Use   Vaping Use: Never used  Substance Use Topics   Alcohol use: Never   Drug use: Never    ROS   Objective:   Vitals: BP 133/70 (BP Location: Left Arm)   Pulse 87   Temp 98.7 F (37.1 C) (Oral)   Resp 16   SpO2 98%   Physical Exam Constitutional:      General: She is not in acute distress.    Appearance: Normal appearance. She is well-developed and normal weight. She is not ill-appearing, toxic-appearing or diaphoretic.  HENT:     Head: Normocephalic and atraumatic.     Right Ear: Tympanic membrane, ear canal and external ear normal. No drainage or tenderness. No middle ear effusion. There is no impacted cerumen. Tympanic membrane is not erythematous.     Left Ear: Tympanic membrane, ear canal and external ear normal. No drainage or tenderness.  No middle ear effusion. There is no impacted cerumen. Tympanic membrane is not erythematous.     Nose: Nose normal. No congestion or rhinorrhea.     Mouth/Throat:     Mouth: Mucous membranes are moist. No oral lesions.     Pharynx: No pharyngeal swelling, oropharyngeal exudate, posterior oropharyngeal erythema or uvula swelling.     Tonsils: No tonsillar exudate or tonsillar abscesses.  Eyes:  General: No scleral icterus.       Right eye: No discharge.        Left eye: No discharge.     Extraocular Movements: Extraocular movements intact.     Right eye: Normal extraocular motion.     Left eye: Normal extraocular motion.     Conjunctiva/sclera: Conjunctivae normal.     Comments: Mild photophobia noted.  Neck:     Meningeal: Brudzinski's sign and Kernig's sign absent.  Cardiovascular:     Rate and Rhythm: Normal rate and regular rhythm.     Heart sounds: Normal heart sounds. No murmur heard.    No friction rub. No gallop.  Pulmonary:     Effort: Pulmonary effort is normal. No  respiratory distress.     Breath sounds: No stridor. No wheezing, rhonchi or rales.  Chest:     Chest wall: No tenderness.  Musculoskeletal:        General: Tenderness (throughout with light palpation) present.     Cervical back: Normal range of motion and neck supple. Tenderness present. No rigidity.  Lymphadenopathy:     Cervical: No cervical adenopathy.  Skin:    General: Skin is warm and dry.  Neurological:     General: No focal deficit present.     Mental Status: She is alert and oriented to person, place, and time.     Cranial Nerves: No cranial nerve deficit.     Motor: No weakness.     Coordination: Coordination normal.     Gait: Gait normal.  Psychiatric:        Mood and Affect: Mood normal.        Behavior: Behavior normal.     Assessment and Plan :   PDMP not reviewed this encounter.  1. Acute viral syndrome    Patient endorsed severe pain throughout her entire body even with the use of the stethoscope on her skin.  Offered IM Toradol at 60 mg.  Low suspicion for an acute encephalopathy, meningitis.  We are pursuing an outpatient chest x-ray as I currently do not have a radiology technician onsite. COVID and flu test pending.  We will otherwise manage for viral upper respiratory infection.  Physical exam findings reassuring and vital signs stable for discharge. Advised supportive care, offered symptomatic relief. Counseled patient on potential for adverse effects with medications prescribed/recommended today, ER and return-to-clinic precautions discussed, patient verbalized understanding.      Jaynee Eagles, PA-C 05/17/22 1051

## 2022-05-21 ENCOUNTER — Ambulatory Visit: Payer: Medicaid Other

## 2022-06-02 ENCOUNTER — Ambulatory Visit: Payer: Medicaid Other

## 2022-09-26 ENCOUNTER — Other Ambulatory Visit: Payer: Self-pay

## 2022-09-26 ENCOUNTER — Encounter (HOSPITAL_BASED_OUTPATIENT_CLINIC_OR_DEPARTMENT_OTHER): Payer: Self-pay

## 2022-09-26 ENCOUNTER — Emergency Department (HOSPITAL_BASED_OUTPATIENT_CLINIC_OR_DEPARTMENT_OTHER)
Admission: EM | Admit: 2022-09-26 | Discharge: 2022-09-26 | Disposition: A | Discharge status: Home / Self Care | Payer: Medicaid Other | Attending: Emergency Medicine | Admitting: Emergency Medicine

## 2022-09-26 DIAGNOSIS — K047 Periapical abscess without sinus: Secondary | ICD-10-CM

## 2022-09-26 DIAGNOSIS — K0889 Other specified disorders of teeth and supporting structures: Secondary | ICD-10-CM | POA: Diagnosis present

## 2022-09-26 MED ORDER — OXYCODONE-ACETAMINOPHEN 5-325 MG PO TABS
1.0000 | ORAL_TABLET | Freq: Once | ORAL | Status: AC
  Administered 2022-09-26: 1 via ORAL
  Filled 2022-09-26: qty 1

## 2022-09-26 MED ORDER — AMOXICILLIN-POT CLAVULANATE 875-125 MG PO TABS: 1.0000 | ORAL_TABLET | Freq: Two times a day (BID) | ORAL | 0 refills | Status: DC

## 2022-09-26 MED ORDER — OXYCODONE-ACETAMINOPHEN 5-325 MG PO TABS: 1.0000 | ORAL_TABLET | Freq: Four times a day (QID) | ORAL | 0 refills | Status: DC | PRN

## 2022-09-26 MED ORDER — AMOXICILLIN-POT CLAVULANATE 875-125 MG PO TABS
1.0000 | ORAL_TABLET | Freq: Once | ORAL | Status: AC
  Administered 2022-09-26: 1 via ORAL
  Filled 2022-09-26: qty 1

## 2022-09-26 NOTE — ED Triage Notes (Signed)
Pt reports "something wrong w tooth, haven't been able to eat or sleep x2 days." Pointing to R upper side of jaw. States the earliest she can see Dentist is Monday 8a  Taken ibuprofen, aleve, percocet, no relief.

## 2022-09-26 NOTE — ED Provider Notes (Signed)
[place]  Provider Note  CSN: [id number] Arrival date & time: 09/26/22 0045  History Chief Complaint  Patient presents with   Dental Pain    Janet Mckee is a 27 y.o. female reports 2 days of R upper wisdom tooth pain. Not improved with motrin or percocet she had left over from prior knee surgery. Some bleeding with brushing. No fevers.    Home Medications Prior to Admission medications   Medication Sig Start Date End Date Taking? Authorizing Provider  amoxicillin-clavulanate (AUGMENTIN) 875-125 MG tablet Take 1 tablet by mouth every 12 (twelve) hours. 09/26/22  Yes Truddie Hidden, MD  oxyCODONE-acetaminophen (PERCOCET/ROXICET) 5-325 MG tablet Take 1 tablet by mouth every 6 (six) hours as needed for severe pain. 09/26/22  Yes Truddie Hidden, MD  ibuprofen (ADVIL) 600 MG tablet Take 1 tablet (600 mg total) by mouth every 6 (six) hours as needed. 05/17/22   Jaynee Eagles, PA-C  levonorgestrel-ethinyl estradiol (ALESSE) 0.1-20 MG-MCG tablet Take 1 tablet by mouth daily.    [provider]  ondansetron (ZOFRAN-ODT) 8 MG disintegrating tablet Take 1 tablet (8 mg total) by mouth every 8 (eight) hours as needed for nausea or vomiting. 05/17/22   Jaynee Eagles, PA-C  promethazine-dextromethorphan (PROMETHAZINE-DM) 6.25-15 MG/5ML syrup Take 2.5 mLs by mouth 3 (three) times daily as needed for cough. 05/17/22   Jaynee Eagles, PA-C     Allergies    Contrast media [iodinated contrast media] and Sulfa antibiotics   Review of Systems   Review of Systems Please see HPI for pertinent positives and negatives  Physical Exam BP (!) 163/103 (BP Location: Right Arm)   Pulse (!) 101   Temp 98 F (36.7 C) (Oral)   Resp 18   SpO2 100%   Physical Exam Vitals and nursing note reviewed.  HENT:     Head: Normocephalic.     Nose: Nose normal.     Mouth/Throat:     Comments: Erythema and induration surrounding tooth #16, no drainable  abscess Eyes:     Extraocular Movements: Extraocular movements intact.  Pulmonary:     Effort: Pulmonary effort is normal.  Musculoskeletal:        General: Normal range of motion.     Cervical back: Neck supple.  Skin:    Findings: No rash (on exposed skin).  Neurological:     Mental Status: She is alert and oriented to person, place, and time.  Psychiatric:        Mood and Affect: Mood normal.     ED Results / Procedures / Treatments   EKG None  Procedures Procedures  Medications Ordered in the ED Medications  oxyCODONE-acetaminophen (PERCOCET/ROXICET) 5-325 MG per tablet 1 tablet (has no administration in time range)  amoxicillin-clavulanate (AUGMENTIN) 875-125 MG per tablet 1 tablet (1 tablet Oral Given 09/26/22 0101)    Initial Impression and Plan  Patient here with dental infection. She has a dentist appointment on Monday morning. Will Rx Augmentin, percocet for pain. RTED for any other concerns.   ED Course       MDM Rules/Calculators/A&P Medical Decision Making Problems Addressed: Dental abscess: acute illness or injury  Risk Prescription drug management.     Final Clinical Impression(s) / ED Diagnoses Final diagnoses:  Dental abscess    Rx / DC Orders ED Discharge Orders          Ordered    amoxicillin-clavulanate (AUGMENTIN) 875-125 MG tablet  Every 12 hours  09/26/22 0101    oxyCODONE-acetaminophen (PERCOCET/ROXICET) 5-325 MG tablet  Every 6 hours PRN        09/26/22 0101             Truddie Hidden, MD 09/26/22 [phone]

## 2023-02-08 ENCOUNTER — Ambulatory Visit
Admission: EM | Admit: 2023-02-08 | Discharge: 2023-02-08 | Disposition: A | Discharge status: Home / Self Care | Payer: Medicaid Other | Attending: Urgent Care | Admitting: Urgent Care

## 2023-02-08 ENCOUNTER — Ambulatory Visit (INDEPENDENT_AMBULATORY_CARE_PROVIDER_SITE_OTHER): Payer: Medicaid Other

## 2023-02-08 DIAGNOSIS — J329 Chronic sinusitis, unspecified: Secondary | ICD-10-CM

## 2023-02-08 DIAGNOSIS — J40 Bronchitis, not specified as acute or chronic: Secondary | ICD-10-CM

## 2023-02-08 MED ORDER — PROMETHAZINE-DM 6.25-15 MG/5ML PO SYRP: 5.0000 mL | ORAL_SOLUTION | Freq: Three times a day (TID) | ORAL | 0 refills | Status: DC | PRN

## 2023-02-08 MED ORDER — FLUCONAZOLE 150 MG PO TABS: 150.0000 mg | ORAL_TABLET | ORAL | 0 refills | Status: DC

## 2023-02-08 MED ORDER — PREDNISONE 20 MG PO TABS: ORAL_TABLET | ORAL | 0 refills | Status: DC

## 2023-02-08 MED ORDER — CETIRIZINE HCL 10 MG PO TABS: 10.0000 mg | ORAL_TABLET | Freq: Every day | ORAL | 0 refills | Status: DC

## 2023-02-08 NOTE — Discharge Instructions (Addendum)
I will manage your symptoms for sinobronchitis. Once I get your x-ray report - I will call you and review this with you including making any changes to medications and diagnoses based off of the report. Otherwise, in the meantime, let's have you start prednisone, Zyrtec, cough syrup (as needed). Avoid any exposure to respiratory irritants, mold, smoke in so much as you can.

## 2023-02-08 NOTE — ED Provider Notes (Signed)
[facility]  Note:  This document was prepared using Conservation officer, historic buildings and may include unintentional dictation errors.  MRN: [id number] DOB: 20-Apr-1996  Subjective:   Janet Mckee is a 27 y.o. female presenting for 3-week history of midsternal chest pain that radiates to the back, hoarseness and shortness of breath for the past 1 to 2 weeks.  No fever, runny or stuffy nose, painful swallowing, wheezing.  No history of asthma. No smoking of any kind including cigarettes, cigars, vaping, marijuana use.    Takes Alesse.   Allergies  Allergen Reactions   Contrast Media [Iodinated Contrast Media] Hives   Sulfa Antibiotics Hives    Past Medical History:  Diagnosis Date   Depression    PP with 2nd child, ok child   Gestational diabetes    Infection    UTI   Ovarian cyst    PONV (postoperative nausea and vomiting)      Past Surgical History:  Procedure Laterality Date   CESAREAN SECTION Bilateral 11/26/2019   Procedure: Cesarean section ;  Surgeon: Essie Hart, MD;  Location: [facility];  Service: Obstetrics;  Laterality: Bilateral;   CHONDROPLASTY Right 02/04/2022   Procedure: CHONDROPLASTY;  Surgeon: Bjorn Pippin, MD;  Location: [facility];  Service: Orthopedics;  Laterality: Right;   FASCIOTOMY Right 02/04/2022   Procedure: FASCIOTOMY;  Surgeon: Bjorn Pippin, MD;  Location: [facility];  Service: Orthopedics;  Laterality: Right;   Guido Sander SLIDE Right 02/04/2022   Procedure: Conan Bowens;  Surgeon: Bjorn Pippin, MD;  Location: [facility];  Service: Orthopedics;  Laterality: Right;   KNEE RECONSTRUCTION Right 02/04/2022   Procedure: KNEE RECONSTRUCTION/MPPFL;  Surgeon: Bjorn Pippin, MD;  Location: [facility];  Service: Orthopedics;  Laterality: Right;   KNEE SURGERY Bilateral    patellar realignment   TONSILLECTOMY      Family History  Problem Relation Age of Onset    Anemia Mother     Social History   Tobacco Use   Smoking status: Never   Smokeless tobacco: Never  Vaping Use   Vaping Use: Never used  Substance Use Topics   Alcohol use: Never   Drug use: Never    ROS   Objective:   Vitals: BP 131/81 (BP Location: Right Arm)   Pulse 88   Temp 98.5 F (36.9 C) (Oral)   Resp 20   SpO2 97%   Physical Exam Constitutional:      General: She is not in acute distress.    Appearance: Normal appearance. She is well-developed. She is not ill-appearing, toxic-appearing or diaphoretic.  HENT:     Head: Normocephalic and atraumatic.     Nose: Nose normal.     Mouth/Throat:     Mouth: Mucous membranes are moist.     Pharynx: No pharyngeal swelling, oropharyngeal exudate, posterior oropharyngeal erythema or uvula swelling.     Tonsils: No tonsillar exudate or tonsillar abscesses. 0 on the right. 0 on the left.     Comments: Thick wall postnasal drainage overlying pharynx. Eyes:     General: No scleral icterus.       Right eye: No discharge.        Left eye: No discharge.     Extraocular Movements: Extraocular movements intact.  Cardiovascular:     Rate and Rhythm: Normal rate and regular rhythm.     Pulses: No decreased pulses.     Heart sounds: Normal  heart sounds. No murmur heard.    No friction rub. No gallop.  Pulmonary:     Effort: Pulmonary effort is normal. No respiratory distress.     Breath sounds: No stridor. No wheezing, rhonchi or rales.  Chest:     Chest wall: No tenderness.  Skin:    General: Skin is warm and dry.  Neurological:     General: No focal deficit present.     Mental Status: She is alert and oriented to person, place, and time.  Psychiatric:        Mood and Affect: Mood normal.        Behavior: Behavior normal.     DG Chest 2 View  Result Date: 02/08/2023 CLINICAL DATA:  Hoarseness, shortness of breath with activity for 3 weeks, chest pain EXAM: CHEST - 2 VIEW COMPARISON:  None Available. FINDINGS: The  heart size and mediastinal contours are within normal limits. Both lungs are clear. The visualized skeletal structures are unremarkable. IMPRESSION: No acute abnormality of the lungs. Electronically Signed   By: Jearld Lesch M.D.   On: 02/08/2023 13:14     Assessment and Plan :   PDMP not reviewed this encounter.  1. Sinobronchitis    Recommended management for sinobronchitis with prednisone.  Use supportive care otherwise.  Discussed possibility of pulmonary embolism but will defer ER visit for this for now.  Patient was in agreement.  Counseled patient on potential for adverse effects with medications prescribed/recommended today, ER and return-to-clinic precautions discussed, patient verbalized understanding.    Wallis Bamberg, PA-C 02/08/23 1323

## 2023-02-08 NOTE — ED Triage Notes (Addendum)
Pt c/o hoarse x 1 week-SHOB with activity x 3 weeks-chest pain radiates to back x today-denies chest pain at present-NAD-steady gait

## 2023-06-14 ENCOUNTER — Ambulatory Visit
Admission: EM | Admit: 2023-06-14 | Discharge: 2023-06-14 | Disposition: A | Discharge status: Home / Self Care | Payer: Medicaid Other | Attending: Internal Medicine | Admitting: Internal Medicine

## 2023-06-14 DIAGNOSIS — B349 Viral infection, unspecified: Secondary | ICD-10-CM | POA: Diagnosis not present

## 2023-06-14 DIAGNOSIS — B09 Unspecified viral infection characterized by skin and mucous membrane lesions: Secondary | ICD-10-CM | POA: Insufficient documentation

## 2023-06-14 DIAGNOSIS — Z1152 Encounter for screening for COVID-19: Secondary | ICD-10-CM | POA: Insufficient documentation

## 2023-06-14 LAB — POCT INFLUENZA A/B
Influenza A, POC: NEGATIVE
Influenza B, POC: NEGATIVE

## 2023-06-14 LAB — POCT RAPID STREP A (OFFICE): Rapid Strep A Screen: NEGATIVE

## 2023-06-14 MED ORDER — IBUPROFEN 600 MG PO TABS: 600.0000 mg | ORAL_TABLET | Freq: Four times a day (QID) | ORAL | 0 refills | Status: AC | PRN

## 2023-06-14 MED ORDER — CETIRIZINE HCL 10 MG PO TABS: 10.0000 mg | ORAL_TABLET | Freq: Every day | ORAL | 0 refills | Status: AC

## 2023-06-14 MED ORDER — PROMETHAZINE-DM 6.25-15 MG/5ML PO SYRP: 5.0000 mL | ORAL_SOLUTION | Freq: Three times a day (TID) | ORAL | 0 refills | Status: AC | PRN

## 2023-06-14 MED ORDER — PSEUDOEPHEDRINE HCL 60 MG PO TABS: 60.0000 mg | ORAL_TABLET | Freq: Three times a day (TID) | ORAL | 0 refills | Status: AC | PRN

## 2023-06-14 NOTE — ED Provider Notes (Signed)
[facility]  Note:  This document was prepared using Conservation officer, historic buildings and may include unintentional dictation errors.  MRN: [id number] DOB: Sep 14, 1995  Subjective:   Janet Mckee is a 27 y.o. female presenting for 1 day history of acute onset persistent malaise, fatigue, dry cough, fevers, body pains now having a rash over the face.  Both of her sons have actually been sick.  One of them has had 2 weeks of an illness, has been seen by the pediatrician for a similar illness with a rash.  Her other son has not been sick as long but has similar types of rash.  Patient requires documentation for testing, a diagnosis for court.  Patient has had longstanding difficulty with laryngitis, loss of her voice.  She is working with an ENT specialist for this.  No current facility-administered medications for this encounter.  Current Outpatient Medications:    cetirizine (ZYRTEC ALLERGY) 10 MG tablet, Take 1 tablet (10 mg total) by mouth daily., Disp: 30 tablet, Rfl: 0   fluconazole (DIFLUCAN) 150 MG tablet, Take 1 tablet (150 mg total) by mouth every 3 (three) days., Disp: 2 tablet, Rfl: 0   levonorgestrel-ethinyl estradiol (ALESSE) 0.1-20 MG-MCG tablet, Take 1 tablet by mouth daily., Disp: , Rfl:    predniSONE (DELTASONE) 20 MG tablet, Take 2 tablets daily with breakfast., Disp: 10 tablet, Rfl: 0   promethazine-dextromethorphan (PROMETHAZINE-DM) 6.25-15 MG/5ML syrup, Take 5 mLs by mouth 3 (three) times daily as needed for cough., Disp: 200 mL, Rfl: 0   Allergies  Allergen Reactions   Contrast Media [Iodinated Contrast Media] Hives   Sulfa Antibiotics Hives    Past Medical History:  Diagnosis Date   Depression    PP with 2nd child, ok child   Gestational diabetes    Infection    UTI   Ovarian cyst    PONV (postoperative nausea and vomiting)      Past Surgical History:  Procedure Laterality Date   CESAREAN SECTION Bilateral 11/26/2019   Procedure:  Cesarean section ;  Surgeon: Essie Hart, MD;  Location: [facility];  Service: Obstetrics;  Laterality: Bilateral;   CHONDROPLASTY Right 02/04/2022   Procedure: CHONDROPLASTY;  Surgeon: Bjorn Pippin, MD;  Location: [facility];  Service: Orthopedics;  Laterality: Right;   FASCIOTOMY Right 02/04/2022   Procedure: FASCIOTOMY;  Surgeon: Bjorn Pippin, MD;  Location: [facility];  Service: Orthopedics;  Laterality: Right;   Guido Sander SLIDE Right 02/04/2022   Procedure: Conan Bowens;  Surgeon: Bjorn Pippin, MD;  Location: [facility];  Service: Orthopedics;  Laterality: Right;   KNEE RECONSTRUCTION Right 02/04/2022   Procedure: KNEE RECONSTRUCTION/MPPFL;  Surgeon: Bjorn Pippin, MD;  Location: [facility];  Service: Orthopedics;  Laterality: Right;   KNEE SURGERY Bilateral    patellar realignment   TONSILLECTOMY      Family History  Problem Relation Age of Onset   Anemia Mother     Social History   Tobacco Use   Smoking status: Never   Smokeless tobacco: Never  Vaping Use   Vaping status: Never Used  Substance Use Topics   Alcohol use: Never   Drug use: Never    ROS   Objective:   Vitals: BP 121/80 (BP Location: Right Arm)   Pulse 86   Temp 98.3 F (36.8 C) (Oral)   Resp 20   SpO2 97%   Physical Exam Constitutional:  General: She is not in acute distress.    Appearance: Normal appearance. She is well-developed and normal weight. She is not ill-appearing, toxic-appearing or diaphoretic.  HENT:     Head: Normocephalic and atraumatic.     Right Ear: Tympanic membrane, ear canal and external ear normal. No drainage or tenderness. No middle ear effusion. There is no impacted cerumen. Tympanic membrane is not erythematous or bulging.     Left Ear: Tympanic membrane, ear canal and external ear normal. No drainage or tenderness.  No middle ear effusion. There is no impacted cerumen. Tympanic membrane is not erythematous  or bulging.     Nose: Congestion present. No rhinorrhea.     Mouth/Throat:     Mouth: Mucous membranes are moist. No oral lesions.     Pharynx: No pharyngeal swelling, oropharyngeal exudate, posterior oropharyngeal erythema or uvula swelling.     Tonsils: No tonsillar exudate or tonsillar abscesses.     Comments: Hoarseness of voice noted. Eyes:     General: No scleral icterus.       Right eye: No discharge.        Left eye: No discharge.     Extraocular Movements: Extraocular movements intact.     Right eye: Normal extraocular motion.     Left eye: Normal extraocular motion.     Conjunctiva/sclera: Conjunctivae normal.     Pupils: Pupils are equal, round, and reactive to light.  Cardiovascular:     Rate and Rhythm: Normal rate and regular rhythm.     Heart sounds: Normal heart sounds. No murmur heard.    No friction rub. No gallop.  Pulmonary:     Effort: Pulmonary effort is normal. No respiratory distress.     Breath sounds: No stridor. No wheezing, rhonchi or rales.  Chest:     Chest wall: No tenderness.  Musculoskeletal:     Cervical back: Normal range of motion and neck supple.  Lymphadenopathy:     Cervical: No cervical adenopathy.  Skin:    General: Skin is warm and dry.     Findings: Rash (papular lesions scattered scantly over face) present.  Neurological:     General: No focal deficit present.     Mental Status: She is alert and oriented to person, place, and time.  Psychiatric:        Mood and Affect: Mood normal.        Behavior: Behavior normal.     Assessment and Plan :   PDMP not reviewed this encounter.  1. Viral exanthem   2. Viral illness    Testing pending, will update patient as results return.  Suspect a viral exanthem, general viral illness.  Recommend supportive care.  Deferred imaging given clear cardiopulmonary exam, hemodynamically stable vital signs.  Documentation provided for her court appearance.  Counseled patient on potential for adverse  effects with medications prescribed/recommended today, ER and return-to-clinic precautions discussed, patient verbalized understanding.    Wallis Bamberg, [place] 06/14/23 [phone]

## 2023-06-14 NOTE — Discharge Instructions (Addendum)
Will update you later with your influenza and strep results. Your COVID test result will be available tomorrow. We will manage this as a viral illness, viral exanthem. For sore throat or cough try using a honey-based tea. Use 3 teaspoons of honey with juice squeezed from half lemon. Place shaved pieces of ginger into 1/2-1 cup of water and warm over stove top. Then mix the ingredients and repeat every 4 hours as needed. Please take ibuprofen 600mg  every 6 hours with food alternating with OR taken together with Tylenol 500mg -650mg  every 6 hours for throat pain, fevers, aches and pains. Hydrate very well with at least 2 liters of water. Eat light meals such as soups (chicken and noodles, vegetable, chicken and wild rice).  Do not eat foods that you are allergic to.  Taking an antihistamine like Zyrtec (10mg  daily) can help against postnasal drainage, sinus congestion which can cause sinus pain, sinus headaches, throat pain, painful swallowing, coughing.  You can take this together with pseudoephedrine (Sudafed) at a dose of 60 mg 3 times a day or twice daily as needed for the same kind of nasal drip, congestion.  However, limit your use of pseudoephedrine if you have high blood pressure or avoid altogether if you have abnormal heart rhythms, heart condition.

## 2023-06-14 NOTE — ED Triage Notes (Signed)
Pt c/o dry cough, fever, body aches started yesterday-last dose tylenol 1.5 hrs PTA-laryngitis x 3 months/being followed by ENT for c/o-NAD-steady gait

## 2023-06-15 LAB — SARS CORONAVIRUS 2 (TAT 6-24 HRS): SARS Coronavirus 2: NEGATIVE

## 2023-09-01 IMAGING — XA DG KNEE 1-2V*R*
1 series · 2 of 2 positions shown · non-contrast
Comparison: Right knee radiographs 12/24/2021; MRI right knee
01/12/2022

CLINICAL DATA: Knee reconstruction. Medial patellofemoral ligament.

EXAM:
RIGHT KNEE - 1-2 VIEW

[Series 1: unknown protocol · 0.30mm/px · 2 of 2 slices shown]
[im 1/2]
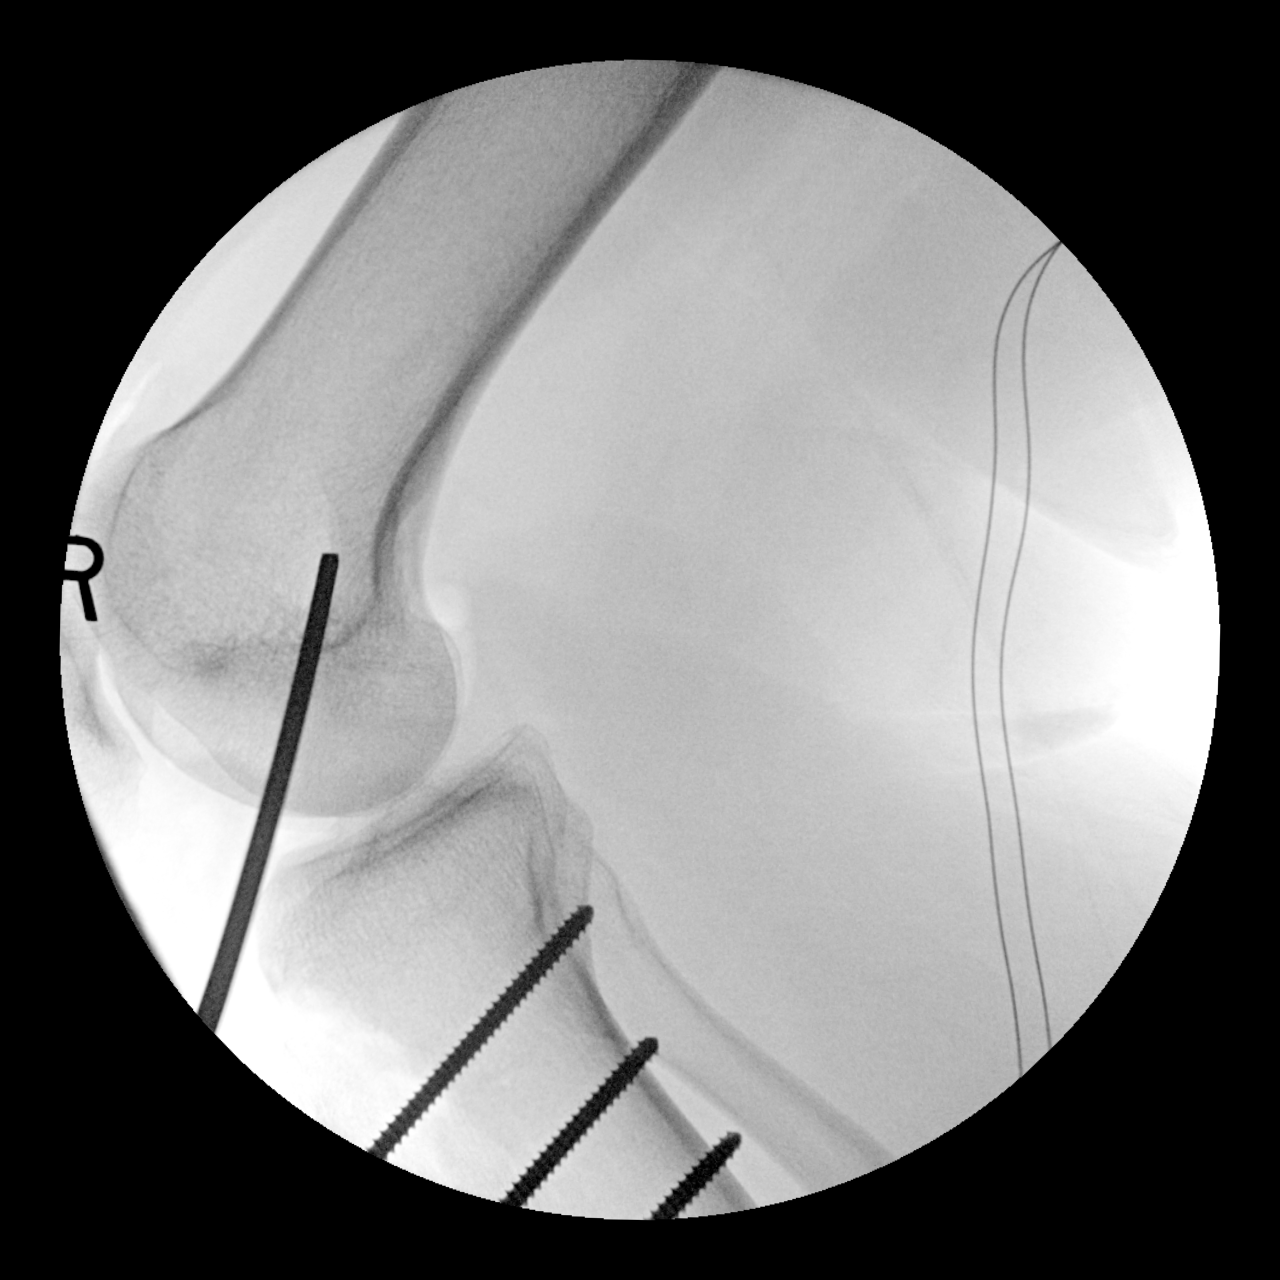
[im 2/2]
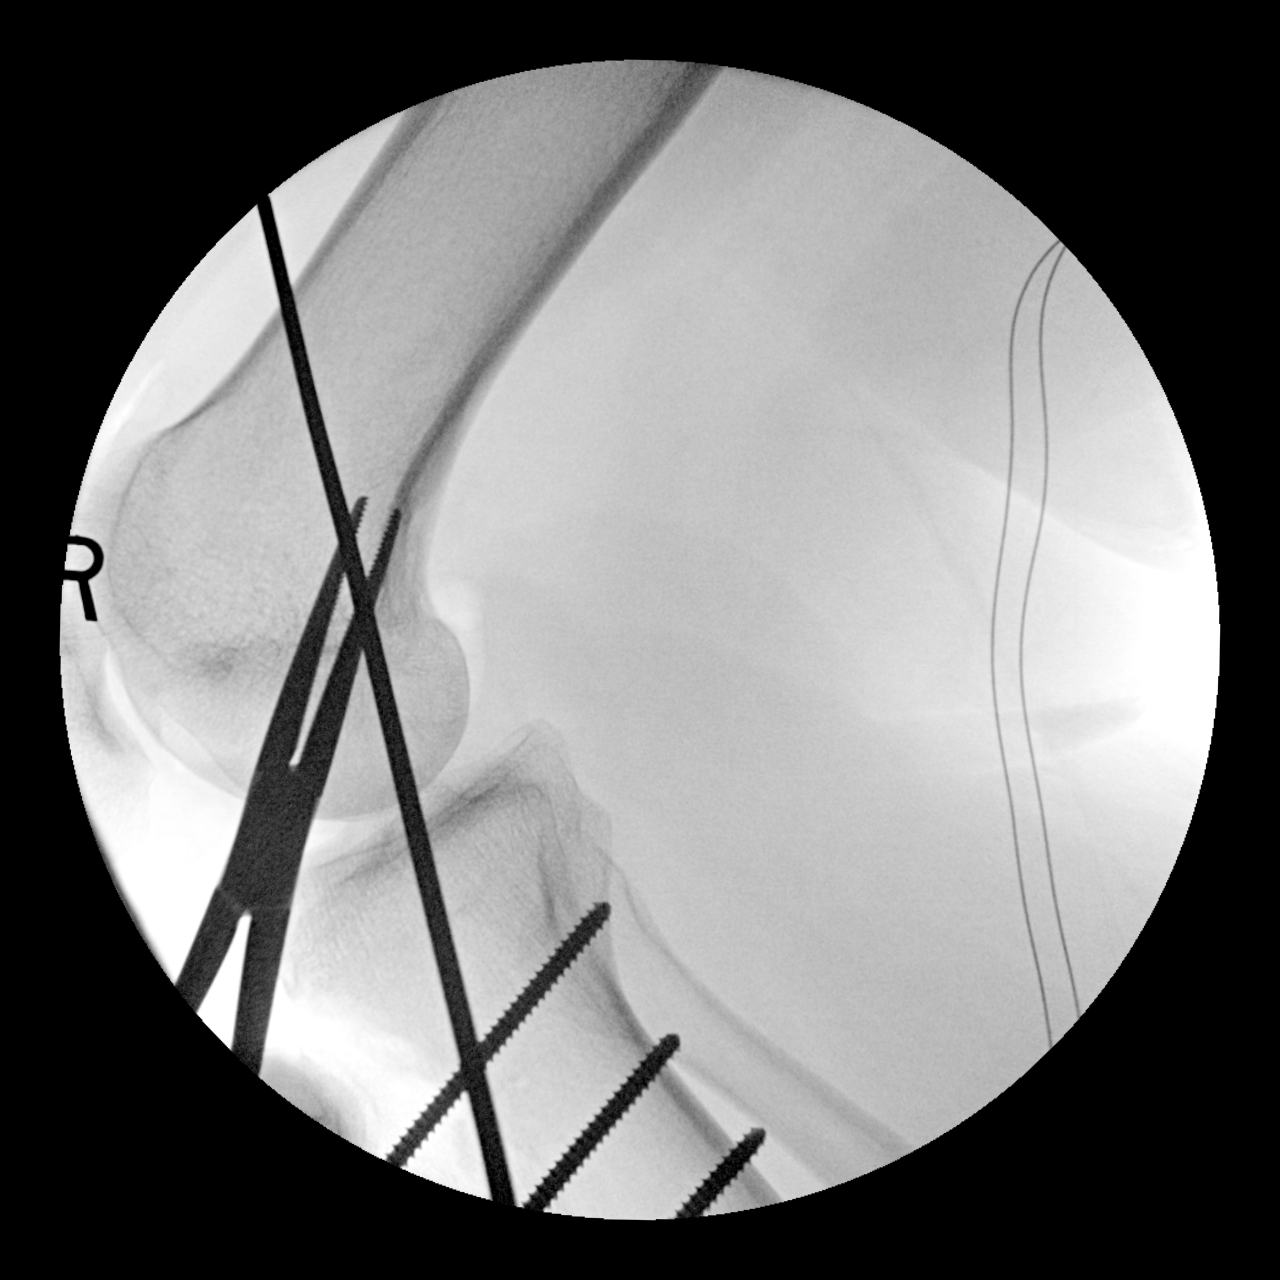

[2 of 2 positions shown; findings below may reference images not displayed]

FINDINGS: Images were performed intraoperatively without the presence of a
radiologist. Two lateral views of the knee. Multiple screws overlie
the proximal tibia. Surgical instrumentation overlies the distal
femur.

Total fluoroscopy images: 2

Total fluoroscopy time: 5 seconds

Total dose: Radiation Exposure Index (as provided by the
fluoroscopic device): 0.34 mGy air Kerma

Please see intraoperative findings for further detail.
IMPRESSION: Intraoperative fluoroscopy for knee surgery. Recommend correlation
with intraprocedural findings.

## 2024-08-03 ENCOUNTER — Ambulatory Visit: Admission: EM | Admit: 2024-08-03 | Discharge: 2024-08-03 | Disposition: A | Discharge status: Home / Self Care

## 2024-08-03 ENCOUNTER — Encounter: Payer: Self-pay | Admitting: Emergency Medicine

## 2024-08-03 DIAGNOSIS — J101 Influenza due to other identified influenza virus with other respiratory manifestations: Secondary | ICD-10-CM

## 2024-08-03 LAB — POC COVID19/FLU A&B COMBO
Covid Antigen, POC: NEGATIVE
Influenza A Antigen, POC: POSITIVE — AB
Influenza B Antigen, POC: NEGATIVE

## 2024-08-03 NOTE — Discharge Instructions (Addendum)

## 2024-08-03 NOTE — ED Triage Notes (Signed)
 Pt presents c/o URI x 2 days. Pt states,  I have a really bad cough and I feel like I can't breathe sometimes. My tummy hurts a little. I think the cough is why my tummy hurts. I keep getting the chills and I'm a little weak.  Pt denies emesis and diarrhea.

## 2024-08-03 NOTE — ED Provider Notes (Signed)
 [facility]    CSN: [id number] Arrival date & time: 08/03/24  1618      History   Chief Complaint Chief Complaint  Patient presents with   URI    HPI Linnell Swords is a 28 y.o. female.   Pt presents today due to sudden onset of nasal congestion and cough that started this morning around 10 am. Pt presents with her son with similar symptoms. Pt denies fever, chills, nausea, or vomiting. Pt denies use of meds for symptoms.   The history is provided by the patient.  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   Gastroesophageal reflux disease 05/10/2023   Hoarse 05/10/2023   Abdominal pain postpartum 12/04/2019   Abdominal pain during pregnancy, unspecified trimester 12/04/2019   Encounter for maternal care for low transverse scar from previous cesarean delivery 11/26/2019   Delivery by elective cesarean section 11/26/2019   Status post primary low transverse cesarean section 11/26/2019   Abnormal glucose tolerance test (GTT) during pregnancy, antepartum 10/24/2019   Pregnancy 12/13/2017   [redacted] weeks gestation of pregnancy 11/24/2017   Threatened labor at term 11/24/2017   Abnormal fetal ultrasound 11/22/2017   Anemia affecting pregnancy 11/14/2017   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   Iron  deficiency anemia 12/04/2014   Polyhydramnios in second trimester 11/12/2014   Pica 09/10/2014   Major depressive disorder, single episode, unspecified 06/18/2013   Episodic mood disorder 06/02/2013    Past Surgical History:  Procedure Laterality Date   CESAREAN SECTION Bilateral 11/26/2019   Procedure: Cesarean section ;  Surgeon: Bettina Muskrat, MD;  Location: [facility];  Service: Obstetrics;   Laterality: Bilateral;   CHONDROPLASTY Right 02/04/2022   Procedure: CHONDROPLASTY;  Surgeon: Cristy Bonner DASEN, MD;  Location: [facility];  Service: Orthopedics;  Laterality: Right;   FASCIOTOMY Right 02/04/2022   Procedure: FASCIOTOMY;  Surgeon: Cristy Bonner DASEN, MD;  Location: [facility];  Service: Orthopedics;  Laterality: Right;   TIA SLIDE Right 02/04/2022   Procedure: TIA RANK;  Surgeon: Cristy Bonner DASEN, MD;  Location: [facility];  Service: Orthopedics;  Laterality: Right;   KNEE RECONSTRUCTION Right 02/04/2022   Procedure: KNEE RECONSTRUCTION/MPPFL;  Surgeon: Cristy Bonner DASEN, MD;  Location: [facility];  Service: Orthopedics;  Laterality: Right;   KNEE SURGERY Bilateral    patellar realignment   TONSILLECTOMY      OB History     Gravida  3   Para  3   Term  3   Preterm  0   AB  0   Living  3      SAB      IAB      Ectopic      Multiple  0   Live Births  3            Home Medications    Prior to Admission medications  Medication Sig Start Date End Date Taking? Authorizing Provider  diazepam (VALIUM) 2 MG tablet Take 2 mg by mouth. 12/08/16  Yes [provider]  omeprazole (PRILOSEC) 40 MG capsule  01/14/15  Yes [provider]  sertraline (ZOLOFT) 50 MG tablet  04/12/23  Yes [provider]  cetirizine  (ZYRTEC  ALLERGY) 10 MG tablet Take 1 tablet (10 mg total) by mouth daily. 06/14/23   Christopher Savannah, PA-C  erythromycin ophthalmic ointment Apply 0.5 inches to eye.    [provider]  HAILEY FE 1.5/30 1.5-30 MG-MCG tablet Take 1 tablet by mouth daily.    [provider]  ibuprofen  (ADVIL ) 600 MG tablet Take 1 tablet (600 mg total) by mouth every 6 (six) hours as needed. 06/14/23   Christopher Savannah, PA-C  levonorgestrel-ethinyl estradiol (ALESSE) 0.1-20 MG-MCG tablet Take 1 tablet by mouth daily.    [provider]  promethazine -dextromethorphan  (PROMETHAZINE -DM) 6.25-15 MG/5ML syrup Take 5 mLs by mouth 3 (three) times daily as needed for cough. 06/14/23   Christopher Savannah, PA-C  pseudoephedrine  (SUDAFED) 60 MG tablet Take 1 tablet (60 mg total) by mouth every 8 (eight) hours as needed for congestion. 06/14/23   Christopher Savannah, PA-C    Family History Family History  Problem Relation Age of Onset   Anemia Mother     Social History Social History[1]   Allergies   Contrast media [iodinated contrast media] and Sulfa antibiotics   Review of Systems Review of Systems   Physical Exam Triage Vital Signs ED Triage Vitals  Encounter Vitals Group     BP 08/03/24 1706 127/82     Girls Systolic BP Percentile --      Girls Diastolic BP Percentile --      Boys Systolic BP Percentile --      Boys Diastolic BP Percentile --      Pulse Rate 08/03/24 1706 93     Resp 08/03/24 1706 18     Temp 08/03/24 1706 98.5 F (36.9 C)     Temp Source 08/03/24 1706 Oral     SpO2 08/03/24 1706 98 %     Weight 08/03/24 1705 199 lb 8.3 oz (90.5 kg)     Height --      Head Circumference --      Peak Flow --      Pain Score 08/03/24 1702 4     Pain Loc --      Pain Education --      Exclude from Growth Chart --    No data found.  Updated Vital Signs BP 127/82 (BP Location: Left Arm)   Pulse 93   Temp 98.5 F (36.9 C) (Oral)   Resp 18   Wt 199 lb 8.3 oz (90.5 kg)   LMP  (LMP Unknown)   SpO2 98%   BMI 36.49 kg/m   Visual Acuity Right Eye Distance:   Left Eye Distance:   Bilateral Distance:    Right Eye Near:   Left Eye Near:    Bilateral Near:     Physical Exam Vitals and nursing note reviewed.  Constitutional:      General: She is not in acute distress.    Appearance: Normal appearance. She is not ill-appearing, toxic-appearing or diaphoretic.  HENT:     Nose: Congestion (moderately enlarged turbinates) present. No rhinorrhea.     Mouth/Throat:     Mouth: Mucous membranes are moist.     Pharynx: Oropharynx is clear. No  oropharyngeal exudate or posterior oropharyngeal erythema.  Eyes:     General: No scleral icterus. Cardiovascular:     Rate and Rhythm: Normal rate and regular rhythm.     Heart sounds: Normal heart sounds.  Pulmonary:  Effort: Pulmonary effort is normal. No respiratory distress.     Breath sounds: Normal breath sounds. No wheezing or rhonchi.  Skin:    General: Skin is warm.  Neurological:     Mental Status: She is alert and oriented to person, place, and time.  Psychiatric:        Mood and Affect: Mood normal.        Behavior: Behavior normal.      UC Treatments / Results  Labs (all labs ordered are listed, but only abnormal results are displayed) Labs Reviewed  POC COVID19/FLU A&B COMBO - Abnormal; Notable for the following components:      Result Value   Influenza A Antigen, POC Positive (*)    All other components within normal limits    EKG   Radiology No results found.  Procedures Procedures (including critical care time)  Medications Ordered in UC Medications - No data to display  Initial Impression / Assessment and Plan / UC Course  I have reviewed the triage vital signs and the nursing notes.  Pertinent labs & imaging results that were available during my care of the patient were reviewed by me and considered in my medical decision making (see chart for details).    Final Clinical Impressions(s) / UC Diagnoses   Final diagnoses:  Influenza A     Discharge Instructions      You been diagnosed with a viral illness today. -Viruses have to run their course and medicines that are prescribed are meant to help with symptoms. - With viruses usually feel poorly from 3 to 7 days with cough being the last symptoms to resolve.  -Cough can linger from days to weeks.  Antibiotics are not effective for viruses. -If your cough lasts more than 2 weeks and you are coughing so hard that you are vomiting or feel like you could pass out we need to follow-up with  PCP for further testing and evaluation. -Rest, increase water intake, may use pseudoephedrine  for nasal congestion, Delsym (dextromethorphan) or honey as needed for cough, and ibuprofen  and/or Tylenol  as directed on packaging for pain and fever. -If you have hypertension you should take Coricidin or other OTC meds approved for people with high blood pressure. -You may use a spoonful of honey every 4-6 hours as needed for throat pain and cough. -Warm tea with honey and lemon are helpful for soothe throat as well.  Chloraseptic and Cepacol make a throat lozenge with numbing medication, can be purchased over-the-counter. -May also use Flonase or sinus rinse for sinus pressure or nasal congestion.  Be sure to use distilled bottled water for sinus rinses. -May use coolmist humidifier to open up nasal passages -May elevate head to assist with postnasal drainage. -If you feel poorly (fever, fatigue, shortness of breath, nausea, etc.) for more than 10 days to be sure to follow-up with PCP or in clinic for further evaluation and additional treatments. If you experience chest pain with shortness of breath or pulse oxygen less than 95% you should report to the ER.     ED Prescriptions   None    PDMP not reviewed this encounter.    [1]  Social History Tobacco Use   Smoking status: Never    Passive exposure: Never   Smokeless tobacco: Never  Vaping Use   Vaping status: Never Used  Substance Use Topics   Alcohol use: Never   Drug use: Never     Andra Corean BROCKS, PA-C 08/03/24 1752
